# Patient Record
Sex: Female | Born: 1955 | Race: White | Hispanic: No | Marital: Married | Smoking: Never smoker
Health system: Southern US, Community
[De-identification: ages and names within clinical notes are randomized; demographics above are authoritative.]

## PROBLEM LIST (undated history)

## (undated) DIAGNOSIS — R112 Nausea with vomiting, unspecified: Secondary | ICD-10-CM

## (undated) DIAGNOSIS — Z9889 Other specified postprocedural states: Secondary | ICD-10-CM

## (undated) DIAGNOSIS — E785 Hyperlipidemia, unspecified: Secondary | ICD-10-CM

## (undated) DIAGNOSIS — I341 Nonrheumatic mitral (valve) prolapse: Secondary | ICD-10-CM

## (undated) DIAGNOSIS — K219 Gastro-esophageal reflux disease without esophagitis: Secondary | ICD-10-CM

## (undated) HISTORY — PX: FOOT NEUROMA SURGERY: SHX646

## (undated) HISTORY — PX: ABDOMINAL HYSTERECTOMY: SHX81

## (undated) HISTORY — PX: CHOLECYSTECTOMY: SHX55

## (undated) HISTORY — PX: OTHER SURGICAL HISTORY: SHX169

---

## 2015-04-22 ENCOUNTER — Encounter (HOSPITAL_BASED_OUTPATIENT_CLINIC_OR_DEPARTMENT_OTHER): Payer: Self-pay | Admitting: *Deleted

## 2015-04-22 ENCOUNTER — Other Ambulatory Visit: Payer: Self-pay | Admitting: Orthopedic Surgery

## 2015-04-23 ENCOUNTER — Other Ambulatory Visit: Payer: Self-pay | Admitting: Orthopedic Surgery

## 2015-04-26 NOTE — H&P (Signed)
Erica Fisher is an 59 y.o. female.   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  She has thus far been treated nonoperatively with a sling and medications, and has undergone a CT scan in addition to initial x-ray evaluation.  Her pain has diminished some, and she has developed extensive bruising down the posterior/lateral aspect of the arm.  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  In addition, she had a previous left humerus fracture that I believe was treated nonoperatively with good result.  Past Medical History  Diagnosis Date  . PONV (postoperative nausea and vomiting)   . Hyperlipemia   . GERD (gastroesophageal reflux disease)   . MVP (mitral valve prolapse)     Past Surgical History  Procedure Laterality Date  . Cholecystectomy    . Abdominal hysterectomy    . Hand surg Left     several reconstructive surgeries following accident  . Foot neuroma surgery      History reviewed. No pertinent family history. Social History:  reports that she has never smoked. She has never used smokeless tobacco. She reports that she does not drink alcohol or use illicit drugs.  Allergies:  Allergies  Allergen Reactions  . Penicillins Rash  . Sulfa Antibiotics Rash    No prescriptions prior to admission    No results found for this or any previous visit (from the past 48 hour(s)). No results found.  Review of Systems  All other systems reviewed and are negative.   Height 5\' 5"  (1.651 m), weight 80.287 kg (177 lb). Physical Exam  Constitutional:  WD, WN, NAD HEENT:  NCAT, EOMI Neuro/Psych:  Alert & oriented to person, place, and time; appropriate mood & affect Lymphatic: No generalized UE  edema or lymphadenopathy Extremities / MSK:  Both UE are normal with respect to appearance, ranges of motion, joint stability, muscle strength/tone, sensation, & perfusion except as otherwise noted:  The right shoulder is swollen, with some visible asymmetry versus the left side and obviously bruised.  Intact light touch sensibility in the axillary distribution as well as the radial, median, and ulnar nerve distributions with intact motor to the same.  Great motion from the elbow down.  Labs / X-rays:  3 views of the right shoulder ordered and obtained today reveals a comminuted four-part proximal humerus fracture with marked displacement of the head had pieces from the shaft, and some articular impaction.  The glenohumeral joint is reduced.  CT scan is also available for review  Assessment: Comminuted displaced closed right four-part humerus fracture  Plan:  I discussed these findings with her and her family, including her son and husband.  We discussed the competing options of continued closed management versus open treatment of the fracture with ORIF versus prosthetic replacement.  After careful deliberation of the pros and cons of the competing options, we elected to proceed with ORIF of this fracture.  We discussed details of the problem, reviewed radiographs and used a plastic model in the process.  I reviewed with her the surgical plan, including incisions and exposure and specific risks such as bleeding, infection, damage to nerves (especially actively nerve), and avascular necrosis.  We will likely proceed next week,  possibly on Wednesday, as I will need to rearrange biweekly schedule in order to best accommodate providing this care.  She was provided a prescription for Percocet in case she would run out between now and then may continue to use her sling, sleeping more upright in a recliner if necessary.  We also discussed some of the postoperative expectations including therapy, et Karie Soda.    Ericberto Padget A. 04/26/2015, 2:53 PM

## 2015-04-28 ENCOUNTER — Ambulatory Visit (HOSPITAL_BASED_OUTPATIENT_CLINIC_OR_DEPARTMENT_OTHER)
Admission: RE | Admit: 2015-04-28 | Discharge: 2015-04-28 | Disposition: A | Discharge status: Home / Self Care | Payer: Worker's Compensation | Source: Ambulatory Visit | Attending: Orthopedic Surgery | Admitting: Orthopedic Surgery

## 2015-04-28 ENCOUNTER — Encounter (HOSPITAL_BASED_OUTPATIENT_CLINIC_OR_DEPARTMENT_OTHER)
Admission: RE | Disposition: A | Discharge status: Home / Self Care | Payer: Self-pay | Source: Ambulatory Visit | Attending: Orthopedic Surgery

## 2015-04-28 ENCOUNTER — Ambulatory Visit (HOSPITAL_COMMUNITY): Payer: Worker's Compensation

## 2015-04-28 ENCOUNTER — Ambulatory Visit (HOSPITAL_BASED_OUTPATIENT_CLINIC_OR_DEPARTMENT_OTHER): Payer: Worker's Compensation | Admitting: Anesthesiology

## 2015-04-28 ENCOUNTER — Encounter (HOSPITAL_BASED_OUTPATIENT_CLINIC_OR_DEPARTMENT_OTHER): Payer: Self-pay | Admitting: Anesthesiology

## 2015-04-28 DIAGNOSIS — Z79899 Other long term (current) drug therapy: Secondary | ICD-10-CM | POA: Diagnosis not present

## 2015-04-28 DIAGNOSIS — E785 Hyperlipidemia, unspecified: Secondary | ICD-10-CM | POA: Insufficient documentation

## 2015-04-28 DIAGNOSIS — S42201A Unspecified fracture of upper end of right humerus, initial encounter for closed fracture: Secondary | ICD-10-CM | POA: Diagnosis not present

## 2015-04-28 DIAGNOSIS — Z419 Encounter for procedure for purposes other than remedying health state, unspecified: Secondary | ICD-10-CM

## 2015-04-28 DIAGNOSIS — Z9049 Acquired absence of other specified parts of digestive tract: Secondary | ICD-10-CM | POA: Insufficient documentation

## 2015-04-28 DIAGNOSIS — W109XXA Fall (on) (from) unspecified stairs and steps, initial encounter: Secondary | ICD-10-CM | POA: Diagnosis not present

## 2015-04-28 DIAGNOSIS — I341 Nonrheumatic mitral (valve) prolapse: Secondary | ICD-10-CM | POA: Diagnosis not present

## 2015-04-28 DIAGNOSIS — K219 Gastro-esophageal reflux disease without esophagitis: Secondary | ICD-10-CM | POA: Insufficient documentation

## 2015-04-28 HISTORY — DX: Hyperlipidemia, unspecified: E78.5

## 2015-04-28 HISTORY — PX: ORIF HUMERUS FRACTURE: SHX2126

## 2015-04-28 HISTORY — DX: Other specified postprocedural states: Z98.890

## 2015-04-28 HISTORY — DX: Gastro-esophageal reflux disease without esophagitis: K21.9

## 2015-04-28 HISTORY — DX: Other specified postprocedural states: R11.2

## 2015-04-28 HISTORY — DX: Nonrheumatic mitral (valve) prolapse: I34.1

## 2015-04-28 SURGERY — OPEN REDUCTION INTERNAL FIXATION (ORIF) PROXIMAL HUMERUS FRACTURE
Anesthesia: General | Site: Shoulder | Laterality: Right

## 2015-04-28 MED ORDER — SCOPOLAMINE 1 MG/3DAYS TD PT72
MEDICATED_PATCH | TRANSDERMAL | Status: AC
  Filled 2015-04-28: qty 1

## 2015-04-28 MED ORDER — ONDANSETRON HCL 4 MG/2ML IJ SOLN
INTRAMUSCULAR | Status: AC
Start: 2015-04-28 — End: 2015-04-28
  Filled 2015-04-28: qty 2

## 2015-04-28 MED ORDER — DEXAMETHASONE SODIUM PHOSPHATE 4 MG/ML IJ SOLN
INTRAMUSCULAR | Status: DC | PRN
  Administered 2015-04-28: 10 mg via INTRAVENOUS

## 2015-04-28 MED ORDER — SUCCINYLCHOLINE CHLORIDE 20 MG/ML IJ SOLN
INTRAMUSCULAR | Status: AC
  Filled 2015-04-28: qty 1

## 2015-04-28 MED ORDER — MIDAZOLAM HCL 2 MG/2ML IJ SOLN
INTRAMUSCULAR | Status: AC
  Filled 2015-04-28: qty 2

## 2015-04-28 MED ORDER — 0.9 % SODIUM CHLORIDE (POUR BTL) OPTIME
TOPICAL | Status: DC | PRN
  Administered 2015-04-28: 200 mL

## 2015-04-28 MED ORDER — SUCCINYLCHOLINE CHLORIDE 20 MG/ML IJ SOLN
INTRAMUSCULAR | Status: DC | PRN
  Administered 2015-04-28: 100 mg via INTRAVENOUS

## 2015-04-28 MED ORDER — MEPERIDINE HCL 25 MG/ML IJ SOLN: 6.2500 mg | INTRAMUSCULAR | Status: DC | PRN

## 2015-04-28 MED ORDER — ONDANSETRON HCL 4 MG/2ML IJ SOLN
INTRAMUSCULAR | Status: DC | PRN
  Administered 2015-04-28: 4 mg via INTRAVENOUS

## 2015-04-28 MED ORDER — FENTANYL CITRATE (PF) 100 MCG/2ML IJ SOLN
INTRAMUSCULAR | Status: AC
  Filled 2015-04-28: qty 2

## 2015-04-28 MED ORDER — FENTANYL CITRATE (PF) 100 MCG/2ML IJ SOLN
50.0000 ug | INTRAMUSCULAR | Status: DC | PRN
  Administered 2015-04-28: 100 ug via INTRAVENOUS

## 2015-04-28 MED ORDER — GLYCOPYRROLATE 0.2 MG/ML IJ SOLN: 0.2000 mg | Freq: Once | INTRAMUSCULAR | Status: DC | PRN

## 2015-04-28 MED ORDER — CEFAZOLIN SODIUM-DEXTROSE 2-3 GM-% IV SOLR
2.0000 g | INTRAVENOUS | Status: AC
  Administered 2015-04-28: 2 g via INTRAVENOUS

## 2015-04-28 MED ORDER — LACTATED RINGERS IV SOLN: INTRAVENOUS | Status: DC

## 2015-04-28 MED ORDER — HYDROMORPHONE HCL 1 MG/ML IJ SOLN
INTRAMUSCULAR | Status: AC
  Filled 2015-04-28: qty 1

## 2015-04-28 MED ORDER — DEXAMETHASONE SODIUM PHOSPHATE 10 MG/ML IJ SOLN
INTRAMUSCULAR | Status: AC
  Filled 2015-04-28: qty 1

## 2015-04-28 MED ORDER — PHENYLEPHRINE HCL 10 MG/ML IJ SOLN
10.0000 mg | INTRAVENOUS | Status: DC | PRN
  Administered 2015-04-28: 25 ug via INTRAVENOUS

## 2015-04-28 MED ORDER — BUPIVACAINE-EPINEPHRINE (PF) 0.5% -1:200000 IJ SOLN
INTRAMUSCULAR | Status: DC | PRN
  Administered 2015-04-28: 25 mL via PERINEURAL

## 2015-04-28 MED ORDER — SCOPOLAMINE 1 MG/3DAYS TD PT72
1.0000 | MEDICATED_PATCH | Freq: Once | TRANSDERMAL | Status: DC | PRN
  Administered 2015-04-28: 1.5 mg via TRANSDERMAL

## 2015-04-28 MED ORDER — MIDAZOLAM HCL 2 MG/2ML IJ SOLN
1.0000 mg | INTRAMUSCULAR | Status: DC | PRN
  Administered 2015-04-28 (×2): 1 mg via INTRAVENOUS
  Administered 2015-04-28: 2 mg via INTRAVENOUS

## 2015-04-28 MED ORDER — OXYCODONE HCL 5 MG PO TABS: 5.0000 mg | ORAL_TABLET | Freq: Once | ORAL | Status: DC | PRN

## 2015-04-28 MED ORDER — LIDOCAINE HCL (CARDIAC) 20 MG/ML IV SOLN
INTRAVENOUS | Status: DC | PRN
  Administered 2015-04-28: 50 mg via INTRAVENOUS

## 2015-04-28 MED ORDER — OXYCODONE HCL 5 MG/5ML PO SOLN
5.0000 mg | Freq: Once | ORAL | Status: DC | PRN
Start: 2015-04-28 — End: 2015-04-28

## 2015-04-28 MED ORDER — CEFAZOLIN SODIUM-DEXTROSE 2-3 GM-% IV SOLR
INTRAVENOUS | Status: AC
  Filled 2015-04-28: qty 50

## 2015-04-28 MED ORDER — LACTATED RINGERS IV SOLN
INTRAVENOUS | Status: DC
  Administered 2015-04-28: 10 mL/h via INTRAVENOUS
  Administered 2015-04-28: 13:00:00 via INTRAVENOUS

## 2015-04-28 MED ORDER — HYDROMORPHONE HCL 1 MG/ML IJ SOLN
0.2500 mg | INTRAMUSCULAR | Status: DC | PRN
  Administered 2015-04-28: 0.5 mg via INTRAVENOUS

## 2015-04-28 MED ORDER — LIDOCAINE HCL (CARDIAC) 20 MG/ML IV SOLN
INTRAVENOUS | Status: AC
  Filled 2015-04-28: qty 5

## 2015-04-28 MED ORDER — PHENYLEPHRINE HCL 10 MG/ML IJ SOLN
INTRAMUSCULAR | Status: DC | PRN
  Administered 2015-04-28: 80 ug via INTRAVENOUS
  Administered 2015-04-28: 40 ug via INTRAVENOUS
  Administered 2015-04-28: 80 ug via INTRAVENOUS
  Administered 2015-04-28: 40 ug via INTRAVENOUS
  Administered 2015-04-28: 80 ug via INTRAVENOUS

## 2015-04-28 MED ORDER — OXYCODONE-ACETAMINOPHEN 5-325 MG PO TABS: 1.0000 | ORAL_TABLET | Freq: Four times a day (QID) | ORAL | Status: DC | PRN

## 2015-04-28 MED ORDER — PROPOFOL 10 MG/ML IV BOLUS
INTRAVENOUS | Status: DC | PRN
  Administered 2015-04-28: 200 mg via INTRAVENOUS

## 2015-04-28 SURGICAL SUPPLY — 66 items
#2 MAXBRAID PE SUTURE ×8 IMPLANT
BIT DRILL 3.2 (BIT) ×1
BIT DRILL 3.2XCALB NS DISP (BIT) ×1 IMPLANT
BIT DRILL CALIBRATED 2.7 (BIT) ×2 IMPLANT
BIT DRL 3.2XCALB NS DISP (BIT) ×1
BLADE SURG 10 STRL SS (BLADE) ×2 IMPLANT
BLADE SURG 15 STRL LF DISP TIS (BLADE) ×1 IMPLANT
BLADE SURG 15 STRL SS (BLADE) ×1
CHLORAPREP W/TINT 26ML (MISCELLANEOUS) ×2 IMPLANT
CLEANER CAUTERY TIP 5X5 PAD (MISCELLANEOUS) ×1 IMPLANT
DRAPE C-ARM 42X72 X-RAY (DRAPES) ×2 IMPLANT
DRAPE SURG 17X23 STRL (DRAPES) ×2 IMPLANT
DRAPE U 20/CS (DRAPES) IMPLANT
DRAPE U-SHAPE 47X51 STRL (DRAPES) ×2 IMPLANT
DRAPE U-SHAPE 76X120 STRL (DRAPES) ×4 IMPLANT
DRSG ADAPTIC 3X8 NADH LF (GAUZE/BANDAGES/DRESSINGS) ×2 IMPLANT
DRSG PAD ABDOMINAL 8X10 ST (GAUZE/BANDAGES/DRESSINGS) ×2 IMPLANT
DRSG TEGADERM 4X4.75 (GAUZE/BANDAGES/DRESSINGS) IMPLANT
ELECT REM PT RETURN 9FT ADLT (ELECTROSURGICAL) ×2
ELECTRODE REM PT RTRN 9FT ADLT (ELECTROSURGICAL) ×1 IMPLANT
GAUZE SPONGE 4X4 12PLY STRL (GAUZE/BANDAGES/DRESSINGS) ×2 IMPLANT
GLOVE BIO SURGEON STRL SZ7.5 (GLOVE) ×2 IMPLANT
GLOVE BIOGEL PI IND STRL 7.0 (GLOVE) ×3 IMPLANT
GLOVE BIOGEL PI IND STRL 8 (GLOVE) ×1 IMPLANT
GLOVE BIOGEL PI INDICATOR 7.0 (GLOVE) ×3
GLOVE BIOGEL PI INDICATOR 8 (GLOVE) ×1
GLOVE ECLIPSE 6.5 STRL STRAW (GLOVE) ×4 IMPLANT
GOWN STRL REUS W/ TWL LRG LVL3 (GOWN DISPOSABLE) ×2 IMPLANT
GOWN STRL REUS W/TWL LRG LVL3 (GOWN DISPOSABLE) ×2
GOWN STRL REUS W/TWL XL LVL3 (GOWN DISPOSABLE) ×2 IMPLANT
K-WIRE 2X5 SS THRDED S3 (WIRE) ×8
KWIRE 2X5 SS THRDED S3 (WIRE) ×4 IMPLANT
NDL SUT 6 .5 CRC .975X.05 MAYO (NEEDLE) ×1 IMPLANT
NEEDLE MAYO TAPER (NEEDLE) ×1
NS IRRIG 1000ML POUR BTL (IV SOLUTION) ×2 IMPLANT
PACK ARTHROSCOPY DSU (CUSTOM PROCEDURE TRAY) ×2 IMPLANT
PACK BASIN DAY SURGERY FS (CUSTOM PROCEDURE TRAY) ×2 IMPLANT
PAD CLEANER CAUTERY TIP 5X5 (MISCELLANEOUS) ×1
PEG LOCKING 3.2MMX44 (Peg) ×2 IMPLANT
PEG LOCKING 3.2X34 (Screw) ×6 IMPLANT
PEG LOCKING 3.2X36 (Screw) ×2 IMPLANT
PEG LOCKING 3.2X38 (Screw) ×2 IMPLANT
PEG LOCKING 3.2X40 (Peg) ×2 IMPLANT
PENCIL BUTTON HOLSTER BLD 10FT (ELECTRODE) ×2 IMPLANT
PLATE PROX HUM HI R 3H 80 (Plate) ×2 IMPLANT
SCREW LOCK CORT STAR 3.5X26 (Screw) ×2 IMPLANT
SCREW LOCK CORT STAR 3.5X28 (Screw) ×2 IMPLANT
SCREW LOCK CORT STAR 3.5X40 (Screw) ×2 IMPLANT
SCREW LP NL T15 3.5X26 (Screw) ×2 IMPLANT
SCREW PEG LOCK 3.2X30MM (Screw) ×2 IMPLANT
SLEEVE MEASURING 3.2 (BIT) ×2 IMPLANT
SLEEVE SCD COMPRESS KNEE MED (MISCELLANEOUS) ×2 IMPLANT
SLING ARM FOAM STRAP LRG (SOFTGOODS) ×2 IMPLANT
SPONGE LAP 18X18 X RAY DECT (DISPOSABLE) ×2 IMPLANT
STAPLER VISISTAT 35W (STAPLE) ×2 IMPLANT
STOCKINETTE 6  STRL (DRAPES) ×1
STOCKINETTE 6 STRL (DRAPES) ×1 IMPLANT
SUCTION FRAZIER TIP 10 FR DISP (SUCTIONS) ×2 IMPLANT
SUPPORT WRAP ARM LG (MISCELLANEOUS) ×2 IMPLANT
SUT VIC AB 2-0 CT1 27 (SUTURE) ×1
SUT VIC AB 2-0 CT1 TAPERPNT 27 (SUTURE) ×1 IMPLANT
SUT VICRYL RAPIDE 4-0 (SUTURE) ×2 IMPLANT
SYR BULB 3OZ (MISCELLANEOUS) ×2 IMPLANT
TOWEL OR 17X24 6PK STRL BLUE (TOWEL DISPOSABLE) ×2 IMPLANT
TOWEL OR NON WOVEN STRL DISP B (DISPOSABLE) ×2 IMPLANT
YANKAUER SUCT BULB TIP NO VENT (SUCTIONS) ×2 IMPLANT

## 2015-04-28 NOTE — Interval H&P Note (Signed)
History and Physical Interval Note:  04/28/2015 12:16 PM  Erica Fisher  has presented today for surgery, with the diagnosis of RIGHT PROXIMAL HUMERUS FRACTURE  The various methods of treatment have been discussed with the patient and family. After consideration of risks, benefits and other options for treatment, the patient has consented to  Procedure(s): OPEN TREATMENT OF RIGHT PROXIMAL HUMERUS FRACTURE (Right) as a surgical intervention .  The patient's history has been reviewed, patient examined, no change in status, stable for surgery.  I have reviewed the patient's chart and labs.  Questions were answered to the patient's satisfaction.     Ronelle Smallman A.

## 2015-04-28 NOTE — Op Note (Signed)
04/28/2015  12:16 PM  PATIENT:  Erica KernsElisabeth Fisher  59 y.o. female  PRE-OPERATIVE DIAGNOSIS:  Displaced right proximal humerus fracture  POST-OPERATIVE DIAGNOSIS:  Same  PROCEDURE:  ORIF right proximal humerus fracture with open biceps tenodesis  SURGEON: Cliffton Astersavid A. Janee Mornhompson, MD  PHYSICIAN ASSISTANT: Danielle RankinKirsten Schrader, OPA-C  ANESTHESIA:  regional and general  SPECIMENS:  None  DRAINS:   None  EBL:  200 mL  PREOPERATIVE INDICATIONS:  Erica Fisher is a  59 y.o. female with a displaced right proximal humerus fracture having fallen down stairs.    The risks benefits and alternatives were discussed with the patient preoperatively including but not limited to the risks of infection, bleeding, nerve injury, cardiopulmonary complications, the need for revision surgery, among others, and the patient verbalized understanding and consented to proceed.  OPERATIVE IMPLANTS: [employer] ALPS proximal humerus plate / screws/pegs  OPERATIVE PROCEDURE:  After receiving prophylactic antibiotics and a regional block, the patient was escorted to the operative theatre and placed in a supine position.  General anesthesia was administered. A surgical "time-out" was performed during which the planned procedure, proposed operative site, and the correct patient identity were compared to the operative consent and agreement confirmed by the circulating nurse according to current facility policy.  The right upper extremity was prepped with DuraPrep and draped in usual sterile fashion. Prophylactic antibiotics were administered. The arm was positioned in an automated positioner.  A deltopectoral approach was made sharply with a scalpel, with blunt dissection once the deltopectoral interval was identified. The deltoid was retracted. The fracture was identified. #2 max braid suture was placed into the cuff posteriorly, superiorly, and also through cuff on the lesser tuberosity fragment. Clot and debris were removed.  The fracture was ultimately better reduced through manipulation digitally and with clamps and positioning. Ultimately, with the greater and lesser tuberosity fragments better reduced having elevated the articular surface and the superior fragment, they were tied to one another. The plate was then laid in its normal position lateral to the bicipital groove it was provisionally secured with a K wire to the shaft. Then, 1 was placed into position for the central guidepin. Images were obtained, but planes for imaging was limited somewhat by the beachchair positioner. Ultimately, it appeared as if the best position improving the varus alignment of the fracture was with the shoulder held abducted, and in this position a couple K wires were placed through the plate into the head to provide a provisional reduction. This was then viewed fluoroscopically and other views and thought to be acceptable at this point. All of the proximal holes were drilled, measured, and filled with smooth pegs. Those down the shaft were drilled and filled with cortical screws, one nonlocking and the others locking. #2 max braid was placed again into the posterior and anterior portions of the cuff near the bone tendon junction and brought through the plate and tied laterally. The same was done with the superior portion of the cuff. The fracture all moved as a unit fluoroscopically and seemed fairly stable. Forward elevation of at least 160 could be obtained passively, and at 90 of abduction, internal rotation measured 60, external rotation close to 90. As I move the shoulder through its range of motion, there is no catching or clicking as might be associated with extra-articular hardware. Rotator interval was split so that the intra-articular portion of the biceps tendon could be excised at the biceps tendon was quite ratty at the fracture site. Biceps tenodesis was performed  to adjacent soft tissues at the extra-articular portion of the tendon  before the tendon was split in the intra-articular portions excised. Rotator interval was then closed with #2 max braid suture. The wound is copiously irrigated and final images were obtained. The deltopectoral interval was reapproximated with 2-0 Vicryl running suture followed by closure of the subcutaneous tissues and skin with 2-0 Vicryl suture, the last layer which was buried subcuticular. The skin was reapproximated with staples. A dressing was applied as was a sling, and she was awakened and taken to the recovery room in stable condition, breathing spontaneously    DISPOSITION: She'll be discharged home with typical instructions returning in approximately 2 weeks with new x-rays of the right shoulder and plans to initiate therapy, which she prefers a be closer to her home.

## 2015-04-28 NOTE — Transfer of Care (Signed)
Immediate Anesthesia Transfer of Care Note  Patient: Erica Fisher  Procedure(s) Performed: Procedure(s): OPEN TREATMENT OF RIGHT PROXIMAL HUMERUS FRACTURE (Right)  Patient Location: PACU  Anesthesia Type:General and GA combined with regional for post-op pain  Level of Consciousness: awake  Airway & Oxygen Therapy: Patient Spontanous Breathing  Post-op Assessment: Report given to RN and Post -op Vital signs reviewed and stable  Post vital signs: Reviewed and stable  Last Vitals:  Filed Vitals:   04/28/15 1055 04/28/15 1100  BP:  146/84  Pulse: 77 84  Temp:    Resp: 15 11    Complications: No apparent anesthesia complications

## 2015-04-28 NOTE — Anesthesia Preprocedure Evaluation (Signed)
Anesthesia Evaluation  Patient identified by MRN, date of birth, ID band Patient awake    Reviewed: Allergy & Precautions, NPO status , Patient's Chart, lab work & pertinent test results  History of Anesthesia Complications (+) PONV  Airway Mallampati: I  TM Distance: >3 FB Neck ROM: Full    Dental  (+) Teeth Intact, Dental Advisory Given   Pulmonary    breath sounds clear to auscultation       Cardiovascular  Rhythm:Regular Rate:Normal     Neuro/Psych    GI/Hepatic GERD  Medicated and Controlled,  Endo/Other    Renal/GU      Musculoskeletal   Abdominal   Peds  Hematology   Anesthesia Other Findings   Reproductive/Obstetrics                             Anesthesia Physical Anesthesia Plan  ASA: II  Anesthesia Plan: General   Post-op Pain Management: MAC Combined w/ Regional for Post-op pain   Induction: Intravenous  Airway Management Planned: Oral ETT  Additional Equipment:   Intra-op Plan:   Post-operative Plan: Extubation in OR  Informed Consent: I have reviewed the patients History and Physical, chart, labs and discussed the procedure including the risks, benefits and alternatives for the proposed anesthesia with the patient or authorized representative who has indicated his/her understanding and acceptance.   Dental advisory given  Plan Discussed with: CRNA, Anesthesiologist and Surgeon  Anesthesia Plan Comments:         Anesthesia Quick Evaluation

## 2015-04-28 NOTE — Discharge Instructions (Addendum)
Discharge Instructions   You have a light dressing on your shoulder and a sling. You may begin gentle motion of your fingers and hand immediately, but you should not do any heavy lifting or gripping.  Elevate your hand to reduce pain & swelling of the digits.  Ice over the operative site may be helpful to reduce pain & swelling.  DO NOT USE HEAT. Pain medicine has been prescribed for you.  Use your medicine as needed over the first 48 hours, and then you can begin to taper your use. You may use Tylenol in place of your prescribed pain medication, but not IN ADDITION to it. Leave the dressing in place until the fifth day after your surgery and then remove it, leaving it open to air.  After the bandage has been removed you may shower, regularly washing the incision and letting the water run over it, but not submerging it (no swimming, soaking it in dishwater, etc.) You may drive a car when you are off of prescription pain medications and can safely control your vehicle with both hands. We will address whether therapy will be required or not when you return to the office. You may have already made your follow-up appointment when we completed your preop visit.  If not, please call our office today or the next business day to make your return appointment for 10-15 days after surgery.   Please call [phone] during normal business hours or [phone] after hours for any problems. Including the following:  - excessive redness of the incisions - drainage for more than 4 days - fever of more than 101.5 F  *Please note that pain medications will not be refilled after hours or on weekends.  Regional Anesthesia Blocks  1. Numbness or the inability to move the "blocked" extremity may last from 3-48 hours after placement. The length of time depends on the medication injected and your individual response to the medication. If the numbness is not going away after 48 hours, call your surgeon.  2. The  extremity that is blocked will need to be protected until the numbness is gone and the  Strength has returned. Because you cannot feel it, you will need to take extra care to avoid injury. Because it may be weak, you may have difficulty moving it or using it. You may not know what position it is in without looking at it while the block is in effect.  3. For blocks in the legs and feet, returning to weight bearing and walking needs to be done carefully. You will need to wait until the numbness is entirely gone and the strength has returned. You should be able to move your leg and foot normally before you try and bear weight or walk. You will need someone to be with you when you first try to ensure you do not fall and possibly risk injury.  4. Bruising and tenderness at the needle site are common side effects and will resolve in a few days.  5. Persistent numbness or new problems with movement should be communicated to the surgeon or the [facility]  [phone])/ [facility] [phone]).  Post Anesthesia Home Care Instructions  Activity: Get plenty of rest for the remainder of the day. A responsible adult should stay with you for 24 hours following the procedure.  For the next 24 hours, DO NOT: -Drive a car -Advertising copywriterperate machinery -Drink alcoholic beverages -Take any medication unless instructed by your physician -Make any legal decisions  or sign important papers.  Meals: Start with liquid foods such as gelatin or soup. Progress to regular foods as tolerated. Avoid greasy, spicy, heavy foods. If nausea and/or vomiting occur, drink only clear liquids until the nausea and/or vomiting subsides. Call your physician if vomiting continues.  Special Instructions/Symptoms: Your throat may feel dry or sore from the anesthesia or the breathing tube placed in your throat during surgery. If this causes discomfort, gargle with warm salt water. The discomfort should disappear within  24 hours.  If you had a scopolamine patch placed behind your ear for the management of post- operative nausea and/or vomiting:  1. The medication in the patch is effective for 72 hours, after which it should be removed.  Wrap patch in a tissue and discard in the trash. Wash hands thoroughly with soap and water. 2. You may remove the patch earlier than 72 hours if you experience unpleasant side effects which may include dry mouth, dizziness or visual disturbances. 3. Avoid touching the patch. Wash your hands with soap and water after contact with the patch.

## 2015-04-28 NOTE — Progress Notes (Signed)
Assisted Dr. Crews with right, ultrasound guided, interscalene  block. Side rails up, monitors on throughout procedure. See vital signs in flow sheet. Tolerated Procedure well. 

## 2015-04-28 NOTE — Anesthesia Postprocedure Evaluation (Signed)
Anesthesia Post Note  Patient: Erica Fisher  Procedure(s) Performed: Procedure(s) (LRB): OPEN TREATMENT OF RIGHT PROXIMAL HUMERUS FRACTURE (Right)  Patient location during evaluation: PACU Anesthesia Type: General Level of consciousness: awake and alert Pain management: pain level controlled Vital Signs Assessment: post-procedure vital signs reviewed and stable Respiratory status: spontaneous breathing, nonlabored ventilation and respiratory function stable Cardiovascular status: blood pressure returned to baseline and stable Postop Assessment: no signs of nausea or vomiting Anesthetic complications: no    Last Vitals:  Filed Vitals:   04/28/15 1545 04/28/15 1600  BP: 131/78 142/67  Pulse: 89 90  Temp:    Resp: 15 12    Last Pain:  Filed Vitals:   04/28/15 1606  PainSc: 4                  [name] A

## 2015-04-28 NOTE — Anesthesia Procedure Notes (Addendum)
Anesthesia Regional Block:  Interscalene brachial plexus block  Pre-Anesthetic Checklist: ,, timeout performed, Correct Patient, Correct Site, Correct Laterality, Correct Procedure, Correct Position, site marked, Risks and benefits discussed,  Surgical consent,  Pre-op evaluation,  At surgeon's request and post-op pain management  Laterality: Right and Upper  Prep: chloraprep       Needles:  Injection technique: Single-shot  Needle Type: Echogenic Needle     Needle Length: 5cm 5 cm Needle Gauge: 21 and 21 G    Additional Needles:  Procedures: ultrasound guided (picture in chart) Interscalene brachial plexus block Narrative:  Start time: 04/28/2015 11:00 AM End time: 04/28/2015 11:05 AM Injection made incrementally with aspirations every 5 mL.  Performed by: Personally  Anesthesiologist: CREWS, DAVID   Procedure Name: Intubation Date/Time: 04/28/2015 12:31 PM Performed by: Caren MacadamARTER, Ad Guttman W Pre-anesthesia Checklist: Patient identified, Emergency Drugs available, Suction available and Patient being monitored Patient Re-evaluated:Patient Re-evaluated prior to inductionOxygen Delivery Method: Circle System Utilized Preoxygenation: Pre-oxygenation with 100% oxygen Intubation Type: IV induction Ventilation: Mask ventilation without difficulty Laryngoscope Size: Miller and 2 Grade View: Grade I Tube type: Oral Tube size: 7.0 mm Number of attempts: 1 Airway Equipment and Method: Stylet and Oral airway Placement Confirmation: ETT inserted through vocal cords under direct vision,  positive ETCO2 and breath sounds checked- equal and bilateral Secured at: 22 cm Tube secured with: Tape Dental Injury: Teeth and Oropharynx as per pre-operative assessment

## 2015-04-29 ENCOUNTER — Encounter (HOSPITAL_BASED_OUTPATIENT_CLINIC_OR_DEPARTMENT_OTHER): Payer: Self-pay | Admitting: Orthopedic Surgery

## 2018-07-22 ENCOUNTER — Other Ambulatory Visit: Payer: Self-pay | Admitting: Orthopedic Surgery

## 2018-07-22 NOTE — Patient Instructions (Signed)
Erica Fisher  07/22/2018   Your procedure is scheduled on: Thursday 07/25/2018  Report to [facility] Main  Entrance              Report to Short Stay at  0530  AM    Call this number if you have problems the morning of surgery [phone]    Remember: Do not eat food or drink liquids :After Midnight.               BRUSH YOUR TEETH MORNING OF SURGERY AND RINSE YOUR MOUTH OUT, NO CHEWING GUM CANDY OR MINTS.     Take these medicines the morning of surgery with A SIP OF WATER: none                                You may not have any metal on your body including hair pins and              piercings  Do not wear jewelry, make-up, lotions, powders or perfumes, deodorant             Do not wear nail polish.  Do not shave  48 hours prior to surgery.             Do not bring valuables to the hospital. [facility] IS NOT             RESPONSIBLE   FOR VALUABLES.  Contacts, dentures or bridgework may not be worn into surgery.  Leave suitcase in the car. After surgery it may be brought to your room.                   Please read over the following fact sheets you were given: _____________________________________________________________________             [facility] - Preparing for Surgery Before surgery, you can play an important role.  Because skin is not sterile, your skin needs to be as free of germs as possible.  You can reduce the number of germs on your skin by washing with CHG (chlorahexidine gluconate) soap before surgery.  CHG is an antiseptic cleaner which kills germs and bonds with the skin to continue killing germs even after washing. Please DO NOT use if you have an allergy to CHG or antibacterial soaps.  If your skin becomes reddened/irritated stop using the CHG and inform your nurse when you arrive at Short Stay. Do not shave (including legs and underarms) for at least 48 hours prior to the first CHG shower.  You may shave your  face/neck. Please follow these instructions carefully:  1.  Shower with CHG Soap the night before surgery and the  morning of Surgery.  2.  If you choose to wash your hair, wash your hair first as usual with your  normal  shampoo.  3.  After you shampoo, rinse your hair and body thoroughly to remove the  shampoo.                           4.  Use CHG as you would any other liquid soap.  You can apply chg directly  to the skin and wash  Gently with a scrungie or clean washcloth.  5.  Apply the CHG Soap to your body ONLY FROM THE NECK DOWN.   Do not use on face/ open                           Wound or open sores. Avoid contact with eyes, ears mouth and genitals (private parts).                       Wash face,  Genitals (private parts) with your normal soap.             6.  Wash thoroughly, paying special attention to the area where your surgery  will be performed.  7.  Thoroughly rinse your body with warm water from the neck down.  8.  DO NOT shower/wash with your normal soap after using and rinsing off  the CHG Soap.                9.  Pat yourself dry with a clean towel.            10.  Wear clean pajamas.            11.  Place clean sheets on your bed the night of your first shower and do not  sleep with pets. Day of Surgery : Do not apply any lotions/deodorants the morning of surgery.  Please wear clean clothes to the hospital/surgery center.  FAILURE TO FOLLOW THESE INSTRUCTIONS MAY RESULT IN THE CANCELLATION OF YOUR SURGERY PATIENT SIGNATURE_________________________________  NURSE SIGNATURE__________________________________  ________________________________________________________________________   Adam Phenix  An incentive spirometer is a tool that can help keep your lungs clear and active. This tool measures how well you are filling your lungs with each breath. Taking long deep breaths may help reverse or decrease the chance of developing breathing  (pulmonary) problems (especially infection) following:  A long period of time when you are unable to move or be active. BEFORE THE PROCEDURE   If the spirometer includes an indicator to show your best effort, your nurse or respiratory therapist will set it to a desired goal.  If possible, sit up straight or lean slightly forward. Try not to slouch.  Hold the incentive spirometer in an upright position. INSTRUCTIONS FOR USE  1. Sit on the edge of your bed if possible, or sit up as far as you can in bed or on a chair. 2. Hold the incentive spirometer in an upright position. 3. Breathe out normally. 4. Place the mouthpiece in your mouth and seal your lips tightly around it. 5. Breathe in slowly and as deeply as possible, raising the piston or the ball toward the top of the column. 6. Hold your breath for 3-5 seconds or for as long as possible. Allow the piston or ball to fall to the bottom of the column. 7. Remove the mouthpiece from your mouth and breathe out normally. 8. Rest for a few seconds and repeat Steps 1 through 7 at least 10 times every 1-2 hours when you are awake. Take your time and take a few normal breaths between deep breaths. 9. The spirometer may include an indicator to show your best effort. Use the indicator as a goal to work toward during each repetition. 10. After each set of 10 deep breaths, practice coughing to be sure your lungs are clear. If you have an incision (the cut made at the time of surgery),  support your incision when coughing by placing a pillow or rolled up towels firmly against it. Once you are able to get out of bed, walk around indoors and cough well. You may stop using the incentive spirometer when instructed by your caregiver.  RISKS AND COMPLICATIONS  Take your time so you do not get dizzy or light-headed.  If you are in pain, you may need to take or ask for pain medication before doing incentive spirometry. It is harder to take a deep breath if you  are having pain. AFTER USE  Rest and breathe slowly and easily.  It can be helpful to keep track of a log of your progress. Your caregiver can provide you with a simple table to help with this. If you are using the spirometer at home, follow these instructions: [place] IF:   You are having difficultly using the spirometer.  You have trouble using the spirometer as often as instructed.  Your pain medication is not giving enough relief while using the spirometer.  You develop fever of 100.5 F (38.1 C) or higher. SEEK IMMEDIATE MEDICAL CARE IF:   You cough up bloody sputum that had not been present before.  You develop fever of 102 F (38.9 C) or greater.  You develop worsening pain at or near the incision site. MAKE SURE YOU:   Understand these instructions.  Will watch your condition.  Will get help right away if you are not doing well or get worse. Document Released: 09/04/2006 Document Revised: 07/17/2011 Document Reviewed: 11/05/2006 Exit[employer] Patient Information 2014 [employer], [place].   ________________________________________________________________________  WHAT IS A BLOOD TRANSFUSION? Blood Transfusion Information  A transfusion is the replacement of blood or some of its parts. Blood is made up of multiple cells which provide different functions.  Red blood cells carry oxygen and are used for blood loss replacement.  White blood cells fight against infection.  Platelets control bleeding.  Plasma helps clot blood.  Other blood products are available for specialized needs, such as hemophilia or other clotting disorders. BEFORE THE TRANSFUSION  Who gives blood for transfusions?   Healthy volunteers who are fully evaluated to make sure their blood is safe. This is blood bank blood. Transfusion therapy is the safest it has ever been in the practice of medicine. Before blood is taken from a donor, a complete history is taken to make sure that person has  no history of diseases nor engages in risky social behavior (examples are intravenous drug use or sexual activity with multiple partners). The donor's travel history is screened to minimize risk of transmitting infections, such as malaria. The donated blood is tested for signs of infectious diseases, such as HIV and hepatitis. The blood is then tested to be sure it is compatible with you in order to minimize the chance of a transfusion reaction. If you or a relative donates blood, this is often done in anticipation of surgery and is not appropriate for emergency situations. It takes many days to process the donated blood. RISKS AND COMPLICATIONS Although transfusion therapy is very safe and saves many lives, the main dangers of transfusion include:   Getting an infectious disease.  Developing a transfusion reaction. This is an allergic reaction to something in the blood you were given. Every precaution is taken to prevent this. The decision to have a blood transfusion has been considered carefully by your caregiver before blood is given. Blood is not given unless the benefits outweigh the risks. AFTER THE TRANSFUSION  Right after receiving a blood transfusion, you will usually feel much better and more energetic. This is especially true if your red blood cells have gotten low (anemic). The transfusion raises the level of the red blood cells which carry oxygen, and this usually causes an energy increase.  The nurse administering the transfusion will monitor you carefully for complications. HOME CARE INSTRUCTIONS  No special instructions are needed after a transfusion. You may find your energy is better. Speak with your caregiver about any limitations on activity for underlying diseases you may have. SEEK MEDICAL CARE IF:   Your condition is not improving after your transfusion.  You develop redness or irritation at the intravenous (IV) site. SEEK IMMEDIATE MEDICAL CARE IF:  Any of the following  symptoms occur over the next 12 hours:  Shaking chills.  You have a temperature by mouth above 102 F (38.9 C), not controlled by medicine.  Chest, back, or muscle pain.  People around you feel you are not acting correctly or are confused.  Shortness of breath or difficulty breathing.  Dizziness and fainting.  You get a rash or develop hives.  You have a decrease in urine output.  Your urine turns a dark color or changes to pink, red, or brown. Any of the following symptoms occur over the next 10 days:  You have a temperature by mouth above 102 F (38.9 C), not controlled by medicine.  Shortness of breath.  Weakness after normal activity.  The white part of the eye turns yellow (jaundice).  You have a decrease in the amount of urine or are urinating less often.  Your urine turns a dark color or changes to pink, red, or brown. Document Released: 04/21/2000 Document Revised: 07/17/2011 Document Reviewed: 12/09/2007 [facility] Patient Information 2014 [place], [place].  _______________________________________________________________________

## 2018-07-23 ENCOUNTER — Encounter (HOSPITAL_COMMUNITY)
Admission: RE | Admit: 2018-07-23 | Discharge: 2018-07-23 | Disposition: A | Discharge status: Home / Self Care | Payer: Worker's Compensation | Source: Ambulatory Visit | Attending: Orthopedic Surgery | Admitting: Orthopedic Surgery

## 2018-07-23 ENCOUNTER — Ambulatory Visit (HOSPITAL_COMMUNITY)
Admission: RE | Admit: 2018-07-23 | Discharge: 2018-07-23 | Disposition: A | Discharge status: Home / Self Care | Payer: Worker's Compensation | Source: Ambulatory Visit | Attending: Orthopedic Surgery | Admitting: Orthopedic Surgery

## 2018-07-23 ENCOUNTER — Encounter (HOSPITAL_COMMUNITY): Payer: Self-pay

## 2018-07-23 ENCOUNTER — Other Ambulatory Visit: Payer: Self-pay

## 2018-07-23 DIAGNOSIS — Z01811 Encounter for preprocedural respiratory examination: Secondary | ICD-10-CM | POA: Diagnosis not present

## 2018-07-23 DIAGNOSIS — Z01818 Encounter for other preprocedural examination: Secondary | ICD-10-CM | POA: Insufficient documentation

## 2018-07-23 LAB — COMPREHENSIVE METABOLIC PANEL
ALT: 20 U/L (ref 0–44)
AST: 25 U/L (ref 15–41)
Albumin: 4.7 g/dL (ref 3.5–5.0)
Alkaline Phosphatase: 87 U/L (ref 38–126)
Anion gap: 9 (ref 5–15)
BUN: 13 mg/dL (ref 6–20)
CHLORIDE: 104 mmol/L (ref 98–111)
CO2: 26 mmol/L (ref 22–32)
Calcium: 9.9 mg/dL (ref 8.9–10.3)
Creatinine, Ser: 0.7 mg/dL (ref 0.44–1.00)
GFR calc Af Amer: >60 mL/min (ref >60)
GFR calc non Af Amer: >60 mL/min (ref >60)
Glucose, Bld: 104 mg/dL — ABNORMAL HIGH (ref 70–99)
Potassium: 4.8 mmol/L (ref 3.5–5.1)
Sodium: 139 mmol/L (ref 135–145)
Total Bilirubin: 0.9 mg/dL (ref 0.3–1.2)
Total Protein: 8 g/dL (ref 6.5–8.1)

## 2018-07-23 LAB — CBC WITH DIFFERENTIAL/PLATELET
Abs Immature Granulocytes: 0.01 10*3/uL (ref 0.00–0.07)
Basophils Absolute: 0 10*3/uL (ref 0.0–0.1)
Basophils Relative: 1 %
Eosinophils Absolute: 0 10*3/uL (ref 0.0–0.5)
Eosinophils Relative: 1 %
HCT: 47.6 % — ABNORMAL HIGH (ref 36.0–46.0)
Hemoglobin: 15 g/dL (ref 12.0–15.0)
Immature Granulocytes: 0 %
Lymphocytes Relative: 28 %
Lymphs Abs: 1.7 10*3/uL (ref 0.7–4.0)
MCH: 30.2 pg (ref 26.0–34.0)
MCHC: 31.5 g/dL (ref 30.0–36.0)
MCV: 96 fL (ref 80.0–100.0)
Monocytes Absolute: 0.3 10*3/uL (ref 0.1–1.0)
Monocytes Relative: 6 %
Neutro Abs: 4 10*3/uL (ref 1.7–7.7)
Neutrophils Relative %: 64 %
PLATELETS: 329 10*3/uL (ref 150–400)
RBC: 4.96 MIL/uL (ref 3.87–5.11)
RDW: 11.9 % (ref 11.5–15.5)
WBC: 6.1 10*3/uL (ref 4.0–10.5)
nRBC: 0 % (ref 0.0–0.2)

## 2018-07-23 LAB — APTT: aPTT: 25 seconds (ref 24–36)

## 2018-07-23 LAB — URINALYSIS, ROUTINE W REFLEX MICROSCOPIC
Bilirubin Urine: NEGATIVE
Glucose, UA: NEGATIVE mg/dL
Hgb urine dipstick: NEGATIVE
Ketones, ur: NEGATIVE mg/dL
Leukocytes,Ua: NEGATIVE
Nitrite: NEGATIVE
Protein, ur: NEGATIVE mg/dL
Specific Gravity, Urine: 1.006 (ref 1.005–1.030)
pH: 6 (ref 5.0–8.0)

## 2018-07-23 LAB — PROTIME-INR
INR: 1 (ref 0.8–1.2)
Prothrombin Time: 12.7 seconds (ref 11.4–15.2)

## 2018-07-23 LAB — SURGICAL PCR SCREEN
MRSA, PCR: NEGATIVE
Staphylococcus aureus: NEGATIVE

## 2018-07-23 LAB — ABO/RH: ABO/RH(D): A POS

## 2018-07-25 ENCOUNTER — Inpatient Hospital Stay (HOSPITAL_COMMUNITY)
Admission: RE | Admit: 2018-07-25 | Payer: Worker's Compensation | Source: Other Acute Inpatient Hospital | Admitting: Orthopedic Surgery

## 2018-07-25 ENCOUNTER — Encounter (HOSPITAL_COMMUNITY): Admission: RE | Payer: Self-pay | Source: Other Acute Inpatient Hospital

## 2018-07-25 LAB — TYPE AND SCREEN
ABO/RH(D): A POS
Antibody Screen: NEGATIVE

## 2018-07-25 SURGERY — ARTHROPLASTY, SHOULDER, TOTAL
Anesthesia: Choice | Laterality: Right

## 2018-08-29 ENCOUNTER — Inpatient Hospital Stay (HOSPITAL_COMMUNITY): Admission: RE | Admit: 2018-08-29 | Payer: Self-pay | Source: Home / Self Care | Admitting: Orthopedic Surgery

## 2018-08-29 ENCOUNTER — Encounter (HOSPITAL_COMMUNITY): Admission: RE | Payer: Self-pay | Source: Home / Self Care

## 2018-08-29 SURGERY — ARTHROPLASTY, SHOULDER, TOTAL
Anesthesia: Choice | Laterality: Right

## 2018-09-09 ENCOUNTER — Other Ambulatory Visit: Payer: Self-pay | Admitting: Orthopedic Surgery

## 2018-09-12 ENCOUNTER — Other Ambulatory Visit: Payer: Self-pay | Admitting: Orthopedic Surgery

## 2018-09-12 NOTE — Progress Notes (Signed)
SPOKE W/  PT     SCREENING SYMPTOMS OF COVID 19:   COUGH--NO  SORE THROAT---NO  NASAL CONGESTION----NO  SNEEZING----NO  SHORTNESS OF BREATH---NO  DIFFICULTY BREATHING---NO  TEMP >100.0 -----NO  UNEXPLAINED BODY ACHES------NO  CHILLS -------- NO  HEADACHES ---------NO  LOSS OF SMELL/ TASTE --------No   HAVE YOU OR ANY FAMILY MEMBER TRAVELLED PAST 14 DAYS OUT OF THE   COUNTY---NO STATE----NO COUNTRY----NO  HAVE YOU OR ANY FAMILY MEMBER BEEN EXPOSED TO ANYONE WITH COVID 19? NO

## 2018-09-12 NOTE — Patient Instructions (Addendum)
Erica Fisher  09/12/2018   Your procedure is scheduled on: 09-19-18    Report to [facility] Main  Entrance    Report to Admitting at 8:05 AM    Call this number if you have problems the morning of surgery [phone]    Remember: NO SOLID FOOD AFTER MIDNIGHT THE NIGHT PRIOR TO SURGERY. NOTHING BY MOUTH EXCEPT CLEAR LIQUIDS UNTIL 3 HOURS PRIOR TO SCHEULED SURGERY. PLEASE FINISH ENSURE DRINK PER SURGEON ORDER 3 HOURS PRIOR TO SCHEDULED SURGERY TIME WHICH NEEDS TO BE COMPLETED AT 4:30 AM.   CLEAR LIQUID DIET   Foods Allowed                                                                     Foods Excluded  Coffee and tea, regular and decaf                             liquids that you cannot  Plain Jell-O in any flavor                                             see through such as: Fruit ices (not with fruit pulp)                                     milk, soups, orange juice  Iced Popsicles                                    All solid food Carbonated beverages, regular and diet                                    Cranberry, grape and apple juices Sports drinks like Gatorade Lightly seasoned clear broth or consume(fat free) Sugar, honey syrup  Sample Menu Breakfast                                Lunch                                     Supper Cranberry juice                    Beef broth                            Chicken broth Jell-O                                     Grape juice  Apple juice Coffee or tea                        Jell-O                                      Popsicle                                                Coffee or tea                        Coffee or tea  _____________________________________________________________________     BRUSH YOUR TEETH MORNING OF SURGERY AND RINSE YOUR MOUTH OUT, NO CHEWING GUM CANDY OR MINTS.     Take these medicines the morning of surgery with A SIP OF WATER: None                                 You may not have any metal on your body including hair pins and              piercings  Do not wear jewelry, make-up, lotions, powders or perfumes, deodorant             Do not wear nail polish.  Do not shave  48 hours prior to surgery.     Do not bring valuables to the hospital. [facility] IS NOT             RESPONSIBLE   FOR VALUABLES.  Contacts, dentures or bridgework may not be worn into surgery.  Leave suitcase in the car. After surgery it may be brought to your room.   Special Instructions: N/A              Please read over the following fact sheets you were given: _____________________________________________________________________             [facility] Pre[facility] Before surgery, you can play an important role.  Because skin is not sterile, your skin needs to be as free of germs as possible.  You can reduce the number of germs on your skin by washing with CHG (chlorahexidine gluconate) soap before surgery.  CHG is an antiseptic cleaner which kills germs and bonds with the skin to continue killing germs even after washing. Please DO NOT use if you have an allergy to CHG or antibacterial soaps.  If your skin becomes reddened/irritated stop using the CHG and inform your nurse when you arrive at Short Stay. Do not shave (including legs and underarms) for at least 48 hours prior to the first CHG shower.  You may shave your face/neck. Please follow these instructions carefully:  1.  Shower with CHG Soap the night before surgery and the  morning of Surgery.  2.  If you choose to wash your hair, wash your hair first as usual with your  normal  shampoo.  3.  After you shampoo, rinse your hair and body thoroughly to remove the  shampoo.                           4.  Use CHG  as you would any other liquid soap.  You can apply chg directly  to the skin and wash                       Gently with a scrungie or clean washcloth.  5.  Apply the CHG Soap to  your body ONLY FROM THE NECK DOWN.   Do not use on face/ open                           Wound or open sores. Avoid contact with eyes, ears mouth and genitals (private parts).                       Wash face,  Genitals (private parts) with your normal soap.             6.  Wash thoroughly, paying special attention to the area where your surgery  will be performed.  7.  Thoroughly rinse your body with warm water from the neck down.  8.  DO NOT shower/wash with your normal soap after using and rinsing off  the CHG Soap.                9.  Pat yourself dry with a clean towel.            10.  Wear clean pajamas.            11.  Place clean sheets on your bed the night of your first shower and do not  sleep with pets. Day of Surgery : Do not apply any lotions/deodorants the morning of surgery.  Please wear clean clothes to the hospital/surgery center.  FAILURE TO FOLLOW THESE INSTRUCTIONS MAY RESULT IN THE CANCELLATION OF YOUR SURGERY PATIENT SIGNATURE_________________________________  NURSE SIGNATURE__________________________________  ________________________________________________________________________   Erica Fisher  An incentive spirometer is a tool that can help keep your lungs clear and active. This tool measures how well you are filling your lungs with each breath. Taking long deep breaths may help reverse or decrease the chance of developing breathing (pulmonary) problems (especially infection) following:  A long period of time when you are unable to move or be active. BEFORE THE PROCEDURE   If the spirometer includes an indicator to show your best effort, your nurse or respiratory therapist will set it to a desired goal.  If possible, sit up straight or lean slightly forward. Try not to slouch.  Hold the incentive spirometer in an upright position. INSTRUCTIONS FOR USE  1. Sit on the edge of your bed if possible, or sit up as far as you can in bed or on a  chair. 2. Hold the incentive spirometer in an upright position. 3. Breathe out normally. 4. Place the mouthpiece in your mouth and seal your lips tightly around it. 5. Breathe in slowly and as deeply as possible, raising the piston or the ball toward the top of the column. 6. Hold your breath for 3-5 seconds or for as long as possible. Allow the piston or ball to fall to the bottom of the column. 7. Remove the mouthpiece from your mouth and breathe out normally. 8. Rest for a few seconds and repeat Steps 1 through 7 at least 10 times every 1-2 hours when you are awake. Take your time and take a few normal breaths between deep breaths. 9. The spirometer may include an indicator to show your best effort.  Use the indicator as a goal to work toward during each repetition. 10. After each set of 10 deep breaths, practice coughing to be sure your lungs are clear. If you have an incision (the cut made at the time of surgery), support your incision when coughing by placing a pillow or rolled up towels firmly against it. Once you are able to get out of bed, walk around indoors and cough well. You may stop using the incentive spirometer when instructed by your caregiver.  RISKS AND COMPLICATIONS  Take your time so you do not get dizzy or light-headed.  If you are in pain, you may need to take or ask for pain medication before doing incentive spirometry. It is harder to take a deep breath if you are having pain. AFTER USE  Rest and breathe slowly and easily.  It can be helpful to keep track of a log of your progress. Your caregiver can provide you with a simple table to help with this. If you are using the spirometer at home, follow these instructions: [place] IF:   You are having difficultly using the spirometer.  You have trouble using the spirometer as often as instructed.  Your pain medication is not giving enough relief while using the spirometer.  You develop fever of 100.5 F  (38.1 C) or higher. SEEK IMMEDIATE MEDICAL CARE IF:   You cough up bloody sputum that had not been present before.  You develop fever of 102 F (38.9 C) or greater.  You develop worsening pain at or near the incision site. MAKE SURE YOU:   Understand these instructions.  Will watch your condition.  Will get help right away if you are not doing well or get worse. Document Released: 09/04/2006 Document Revised: 07/17/2011 Document Reviewed: 11/05/2006 Exit[employer] Patient Information 2014 [employer], [place].   ________________________________________________________________________  WHAT IS A BLOOD TRANSFUSION? Blood Transfusion Information  A transfusion is the replacement of blood or some of its parts. Blood is made up of multiple cells which provide different functions.  Red blood cells carry oxygen and are used for blood loss replacement.  White blood cells fight against infection.  Platelets control bleeding.  Plasma helps clot blood.  Other blood products are available for specialized needs, such as hemophilia or other clotting disorders. BEFORE THE TRANSFUSION  Who gives blood for transfusions?   Healthy volunteers who are fully evaluated to make sure their blood is safe. This is blood bank blood. Transfusion therapy is the safest it has ever been in the practice of medicine. Before blood is taken from a donor, a complete history is taken to make sure that person has no history of diseases nor engages in risky social behavior (examples are intravenous drug use or sexual activity with multiple partners). The donor's travel history is screened to minimize risk of transmitting infections, such as malaria. The donated blood is tested for signs of infectious diseases, such as HIV and hepatitis. The blood is then tested to be sure it is compatible with you in order to minimize the chance of a transfusion reaction. If you or a relative donates blood, this is often done in anticipation  of surgery and is not appropriate for emergency situations. It takes many days to process the donated blood. RISKS AND COMPLICATIONS Although transfusion therapy is very safe and saves many lives, the main dangers of transfusion include:   Getting an infectious disease.  Developing a transfusion reaction. This is an allergic reaction to something in the  blood you were given. Every precaution is taken to prevent this. The decision to have a blood transfusion has been considered carefully by your caregiver before blood is given. Blood is not given unless the benefits outweigh the risks. AFTER THE TRANSFUSION  Right after receiving a blood transfusion, you will usually feel much better and more energetic. This is especially true if your red blood cells have gotten low (anemic). The transfusion raises the level of the red blood cells which carry oxygen, and this usually causes an energy increase.  The nurse administering the transfusion will monitor you carefully for complications. HOME CARE INSTRUCTIONS  No special instructions are needed after a transfusion. You may find your energy is better. Speak with your caregiver about any limitations on activity for underlying diseases you may have. SEEK MEDICAL CARE IF:   Your condition is not improving after your transfusion.  You develop redness or irritation at the intravenous (IV) site. SEEK IMMEDIATE MEDICAL CARE IF:  Any of the following symptoms occur over the next 12 hours:  Shaking chills.  You have a temperature by mouth above 102 F (38.9 C), not controlled by medicine.  Chest, back, or muscle pain.  People around you feel you are not acting correctly or are confused.  Shortness of breath or difficulty breathing.  Dizziness and fainting.  You get a rash or develop hives.  You have a decrease in urine output.  Your urine turns a dark color or changes to pink, red, or brown. Any of the following symptoms occur over the next 10  days:  You have a temperature by mouth above 102 F (38.9 C), not controlled by medicine.  Shortness of breath.  Weakness after normal activity.  The white part of the eye turns yellow (jaundice).  You have a decrease in the amount of urine or are urinating less often.  Your urine turns a dark color or changes to pink, red, or brown. Document Released: 04/21/2000 Document Revised: 07/17/2011 Document Reviewed: 12/09/2007 [facility] Patient Information 2014 [place], [place].  _______________________________________________________________________

## 2018-09-12 NOTE — Progress Notes (Signed)
07-23-18 (Epic) EKG, and CXR

## 2018-09-12 NOTE — Progress Notes (Signed)
Please place surgical orders. Pt is scheduled for her PAT visit on 09-13-18. Thank you.

## 2018-09-13 ENCOUNTER — Other Ambulatory Visit: Payer: Self-pay

## 2018-09-13 ENCOUNTER — Encounter (HOSPITAL_COMMUNITY)
Admission: RE | Admit: 2018-09-13 | Discharge: 2018-09-13 | Disposition: A | Discharge status: Home / Self Care | Payer: Worker's Compensation | Source: Ambulatory Visit | Attending: Orthopedic Surgery | Admitting: Orthopedic Surgery

## 2018-09-13 ENCOUNTER — Encounter (HOSPITAL_COMMUNITY): Payer: Self-pay

## 2018-09-13 DIAGNOSIS — E785 Hyperlipidemia, unspecified: Secondary | ICD-10-CM | POA: Insufficient documentation

## 2018-09-13 DIAGNOSIS — K219 Gastro-esophageal reflux disease without esophagitis: Secondary | ICD-10-CM | POA: Insufficient documentation

## 2018-09-13 DIAGNOSIS — M879 Osteonecrosis, unspecified: Secondary | ICD-10-CM | POA: Diagnosis not present

## 2018-09-13 DIAGNOSIS — I341 Nonrheumatic mitral (valve) prolapse: Secondary | ICD-10-CM | POA: Insufficient documentation

## 2018-09-13 DIAGNOSIS — Z01818 Encounter for other preprocedural examination: Secondary | ICD-10-CM | POA: Diagnosis not present

## 2018-09-13 DIAGNOSIS — Z8781 Personal history of (healed) traumatic fracture: Secondary | ICD-10-CM | POA: Diagnosis not present

## 2018-09-13 LAB — CBC WITH DIFFERENTIAL/PLATELET
Abs Immature Granulocytes: 0 10*3/uL (ref 0.00–0.07)
Basophils Absolute: 0.1 10*3/uL (ref 0.0–0.1)
Basophils Relative: 1 %
Eosinophils Absolute: 0.3 10*3/uL (ref 0.0–0.5)
Eosinophils Relative: 4 %
HCT: 43.7 % (ref 36.0–46.0)
Hemoglobin: 14.4 g/dL (ref 12.0–15.0)
Immature Granulocytes: 0 %
Lymphocytes Relative: 29 %
Lymphs Abs: 1.7 10*3/uL (ref 0.7–4.0)
MCH: 30.9 pg (ref 26.0–34.0)
MCHC: 33 g/dL (ref 30.0–36.0)
MCV: 93.8 fL (ref 80.0–100.0)
Monocytes Absolute: 0.4 10*3/uL (ref 0.1–1.0)
Monocytes Relative: 6 %
Neutro Abs: 3.4 10*3/uL (ref 1.7–7.7)
Neutrophils Relative %: 60 %
Platelets: 323 10*3/uL (ref 150–400)
RBC: 4.66 MIL/uL (ref 3.87–5.11)
RDW: 12.2 % (ref 11.5–15.5)
WBC: 5.7 10*3/uL (ref 4.0–10.5)
nRBC: 0 % (ref 0.0–0.2)

## 2018-09-13 LAB — PROTIME-INR
INR: 1.1 (ref 0.8–1.2)
Prothrombin Time: 13.6 seconds (ref 11.4–15.2)

## 2018-09-13 LAB — URINALYSIS, ROUTINE W REFLEX MICROSCOPIC
Bacteria, UA: NONE SEEN
Bilirubin Urine: NEGATIVE
Glucose, UA: NEGATIVE mg/dL
Hgb urine dipstick: NEGATIVE
Ketones, ur: 80 mg/dL — AB
Nitrite: NEGATIVE
Protein, ur: NEGATIVE mg/dL
Specific Gravity, Urine: 1.013 (ref 1.005–1.030)
pH: 5 (ref 5.0–8.0)

## 2018-09-13 LAB — COMPREHENSIVE METABOLIC PANEL
ALT: 33 U/L (ref 0–44)
AST: 37 U/L (ref 15–41)
Albumin: 4.5 g/dL (ref 3.5–5.0)
Alkaline Phosphatase: 69 U/L (ref 38–126)
Anion gap: 13 (ref 5–15)
BUN: 13 mg/dL (ref 6–20)
CO2: 25 mmol/L (ref 22–32)
Calcium: 9.7 mg/dL (ref 8.9–10.3)
Chloride: 101 mmol/L (ref 98–111)
Creatinine, Ser: 0.69 mg/dL (ref 0.44–1.00)
GFR calc Af Amer: >60 mL/min (ref >60)
GFR calc non Af Amer: >60 mL/min (ref >60)
Glucose, Bld: 80 mg/dL (ref 70–99)
Potassium: 5.1 mmol/L (ref 3.5–5.1)
Sodium: 139 mmol/L (ref 135–145)
Total Bilirubin: 0.9 mg/dL (ref 0.3–1.2)
Total Protein: 7.2 g/dL (ref 6.5–8.1)

## 2018-09-13 LAB — APTT: aPTT: 27 seconds (ref 24–36)

## 2018-09-13 LAB — SURGICAL PCR SCREEN
MRSA, PCR: NEGATIVE
Staphylococcus aureus: NEGATIVE

## 2018-09-16 NOTE — Progress Notes (Signed)
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 Jones Broom.   Urinalysis routed to Dr. Ave Filter.   COVID19 swab ordered, pending.   Pt can proceed with planned procedure barring acute status change.  VS: BP (!) 147/71 (BP Location: Right Arm)   Pulse 62   Temp 36.8 C (Oral)   Resp 18   Ht 5\' 5"  (1.651 m)   Wt 81.3 kg   SpO2 99%   BMI 29.83 kg/m   PROVIDERS: Maia Plan, MD is PCP    LABS: Labs reviewed: Acceptable for surgery. (all labs ordered are listed, but only abnormal results are displayed)  Labs Reviewed  URINALYSIS, ROUTINE W REFLEX MICROSCOPIC - Abnormal; Notable for the following components:      Result Value   Ketones, ur 80 (*)    Leukocytes,Ua SMALL (*)    All other components within normal limits  SURGICAL PCR SCREEN  APTT  CBC WITH DIFFERENTIAL/PLATELET  COMPREHENSIVE METABOLIC PANEL  PROTIME-INR     IMAGES: Chest Xray 07/23/2018 FINDINGS: Mediastinum and hilar structures normal. Lungs are clear. No pleural effusion or pneumothorax. Heart size normal. Postsurgical changes right shoulder. Thoracic spine scoliosis. Degenerative change thoracic spine.  IMPRESSION: No acute cardiopulmonary disease. Postsurgical changes right shoulder.  EKG: 07/23/2018 Rate 69 bpm Normal sinus rhythm  Normal ECG No previous tracing   CV:  Past Medical History:  Diagnosis Date  . GERD (gastroesophageal reflux disease)   . Hyperlipemia   . MVP (mitral valve prolapse)   . PONV (postoperative nausea  and vomiting)     Past Surgical History:  Procedure Laterality Date  . ABDOMINAL HYSTERECTOMY    . CHOLECYSTECTOMY    . FOOT NEUROMA SURGERY    . hand surg Left    several reconstructive surgeries following accident  . ORIF HUMERUS FRACTURE Right 04/28/2015   Procedure: OPEN TREATMENT OF RIGHT PROXIMAL HUMERUS FRACTURE;  Surgeon: Mack Hook, MD;  Location: [facility];  Service: Orthopedics;  Laterality: Right;    MEDICATIONS: . Biotin 1000 MCG tablet  . Cholecalciferol (VITAMIN D3) 125 MCG (5000 UT) CAPS  . Multiple Vitamins-Minerals (MULTIVITAMIN WITH MINERALS) tablet  . Omega-3 Fatty Acids (FISH OIL) 1000 MG CAPS  . Turmeric 500 MG CAPS   No current facility-administered medications for this encounter.     Janey Genta [facility] Pre-Surgical Testing [phone] 09/16/18 10:37 AM

## 2018-09-16 NOTE — Anesthesia Preprocedure Evaluation (Addendum)
Anesthesia Evaluation  Patient identified by MRN, date of birth, ID band Patient awake    Reviewed: Allergy & Precautions, NPO status , Patient's Chart, lab work & pertinent test results  History of Anesthesia Complications (+) PONV and history of anesthetic complications  Airway Mallampati: II  TM Distance: >3 FB Neck ROM: Full    Dental no notable dental hx.    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  Reproductive/Obstetrics                            Anesthesia Physical Anesthesia Plan  ASA: II  Anesthesia Plan: Regional and General   Post-op Pain Management: GA combined w/ Regional for post-op pain   Induction: Intravenous  PONV Risk Score and Plan: 4 or greater and Ondansetron, Dexamethasone, Midazolam and Treatment may vary due to age or medical condition  Airway Management Planned: Oral ETT  Additional Equipment:   Intra-op Plan:   Post-operative Plan: Extubation in OR  Informed Consent: I have reviewed the patients History and Physical, chart, labs and discussed the procedure including the risks, benefits and alternatives for the proposed anesthesia with the patient or authorized representative who has indicated his/her understanding and acceptance.     Dental advisory given  Plan Discussed with: CRNA  Anesthesia Plan Comments: (Reviewed PAT note  09/13/18, Jodell Cipro, PA-C)       Anesthesia Quick Evaluation

## 2018-09-17 ENCOUNTER — Other Ambulatory Visit: Payer: Self-pay

## 2018-09-17 ENCOUNTER — Other Ambulatory Visit (HOSPITAL_COMMUNITY)
Admission: RE | Admit: 2018-09-17 | Discharge: 2018-09-17 | Disposition: A | Discharge status: Home / Self Care | Payer: Worker's Compensation | Source: Ambulatory Visit | Attending: Orthopedic Surgery | Admitting: Orthopedic Surgery

## 2018-09-17 DIAGNOSIS — Z1159 Encounter for screening for other viral diseases: Secondary | ICD-10-CM | POA: Insufficient documentation

## 2018-09-18 LAB — NOVEL CORONAVIRUS, NAA (HOSP ORDER, SEND-OUT TO REF LAB; TAT 18-24 HRS): SARS-CoV-2, NAA: NOT DETECTED

## 2018-09-19 ENCOUNTER — Inpatient Hospital Stay (HOSPITAL_COMMUNITY): Payer: Worker's Compensation | Admitting: Certified Registered Nurse Anesthetist

## 2018-09-19 ENCOUNTER — Encounter (HOSPITAL_COMMUNITY): Payer: Self-pay | Admitting: *Deleted

## 2018-09-19 ENCOUNTER — Inpatient Hospital Stay (HOSPITAL_COMMUNITY)
Admission: RE | Admit: 2018-09-19 | Discharge: 2018-09-20 | DRG: 483 | Disposition: A | Discharge status: Home / Self Care | Payer: Worker's Compensation | Attending: Orthopedic Surgery | Admitting: Orthopedic Surgery

## 2018-09-19 ENCOUNTER — Other Ambulatory Visit: Payer: Self-pay

## 2018-09-19 ENCOUNTER — Encounter (HOSPITAL_COMMUNITY)
Admission: RE | Disposition: A | Discharge status: Home / Self Care | Payer: Self-pay | Source: Home / Self Care | Attending: Orthopedic Surgery

## 2018-09-19 ENCOUNTER — Inpatient Hospital Stay (HOSPITAL_COMMUNITY): Payer: Worker's Compensation

## 2018-09-19 ENCOUNTER — Inpatient Hospital Stay (HOSPITAL_COMMUNITY): Payer: Worker's Compensation | Admitting: Physician Assistant

## 2018-09-19 DIAGNOSIS — M25511 Pain in right shoulder: Secondary | ICD-10-CM | POA: Diagnosis present

## 2018-09-19 DIAGNOSIS — E785 Hyperlipidemia, unspecified: Secondary | ICD-10-CM | POA: Diagnosis present

## 2018-09-19 DIAGNOSIS — Z881 Allergy status to other antibiotic agents status: Secondary | ICD-10-CM

## 2018-09-19 DIAGNOSIS — Z9049 Acquired absence of other specified parts of digestive tract: Secondary | ICD-10-CM

## 2018-09-19 DIAGNOSIS — Z885 Allergy status to narcotic agent status: Secondary | ICD-10-CM

## 2018-09-19 DIAGNOSIS — M87811 Other osteonecrosis, right shoulder: Secondary | ICD-10-CM | POA: Diagnosis not present

## 2018-09-19 DIAGNOSIS — Z888 Allergy status to other drugs, medicaments and biological substances status: Secondary | ICD-10-CM | POA: Diagnosis not present

## 2018-09-19 DIAGNOSIS — K219 Gastro-esophageal reflux disease without esophagitis: Secondary | ICD-10-CM | POA: Diagnosis present

## 2018-09-19 DIAGNOSIS — Z79899 Other long term (current) drug therapy: Secondary | ICD-10-CM

## 2018-09-19 DIAGNOSIS — I341 Nonrheumatic mitral (valve) prolapse: Secondary | ICD-10-CM | POA: Diagnosis present

## 2018-09-19 DIAGNOSIS — Z96619 Presence of unspecified artificial shoulder joint: Secondary | ICD-10-CM

## 2018-09-19 DIAGNOSIS — Z90710 Acquired absence of both cervix and uterus: Secondary | ICD-10-CM

## 2018-09-19 DIAGNOSIS — Z882 Allergy status to sulfonamides status: Secondary | ICD-10-CM | POA: Diagnosis not present

## 2018-09-19 DIAGNOSIS — Z1159 Encounter for screening for other viral diseases: Secondary | ICD-10-CM | POA: Diagnosis not present

## 2018-09-19 DIAGNOSIS — Z880 Allergy status to penicillin: Secondary | ICD-10-CM

## 2018-09-19 HISTORY — PX: TOTAL SHOULDER ARTHROPLASTY: SHX126

## 2018-09-19 HISTORY — PX: MINOR HARDWARE REMOVAL: SHX6474

## 2018-09-19 SURGERY — ARTHROPLASTY, SHOULDER, TOTAL
Anesthesia: Regional | Site: Shoulder | Laterality: Right

## 2018-09-19 MED ORDER — FENTANYL CITRATE (PF) 100 MCG/2ML IJ SOLN: 25.0000 ug | INTRAMUSCULAR | Status: DC | PRN

## 2018-09-19 MED ORDER — ONDANSETRON HCL 4 MG/2ML IJ SOLN
4.0000 mg | Freq: Four times a day (QID) | INTRAMUSCULAR | Status: DC | PRN
  Administered 2018-09-19: 4 mg via INTRAVENOUS
  Filled 2018-09-19: qty 2

## 2018-09-19 MED ORDER — LIDOCAINE 2% (20 MG/ML) 5 ML SYRINGE
INTRAMUSCULAR | Status: AC
  Filled 2018-09-19: qty 5

## 2018-09-19 MED ORDER — OXYCODONE HCL 5 MG PO TABS
5.0000 mg | ORAL_TABLET | ORAL | Status: DC | PRN
  Administered 2018-09-20 (×2): 5 mg via ORAL
  Filled 2018-09-19 (×2): qty 1

## 2018-09-19 MED ORDER — PHENOL 1.4 % MT LIQD: 1.0000 | OROMUCOSAL | Status: DC | PRN

## 2018-09-19 MED ORDER — EPHEDRINE SULFATE-NACL 50-0.9 MG/10ML-% IV SOSY
PREFILLED_SYRINGE | INTRAVENOUS | Status: DC | PRN
  Administered 2018-09-19: 5 mg via INTRAVENOUS
  Administered 2018-09-19: 10 mg via INTRAVENOUS

## 2018-09-19 MED ORDER — ROCURONIUM BROMIDE 10 MG/ML (PF) SYRINGE
PREFILLED_SYRINGE | INTRAVENOUS | Status: AC
  Filled 2018-09-19: qty 10

## 2018-09-19 MED ORDER — ALUM & MAG HYDROXIDE-SIMETH 200-200-20 MG/5ML PO SUSP: 30.0000 mL | ORAL | Status: DC | PRN

## 2018-09-19 MED ORDER — ACETAMINOPHEN 500 MG PO TABS
1000.0000 mg | ORAL_TABLET | Freq: Once | ORAL | Status: AC
  Administered 2018-09-19: 1000 mg via ORAL
  Filled 2018-09-19: qty 2

## 2018-09-19 MED ORDER — SODIUM CHLORIDE 0.9 % IV SOLN
125.0000 mL/h | INTRAVENOUS | Status: DC
  Administered 2018-09-19 – 2018-09-20 (×2): 125 mL/h via INTRAVENOUS

## 2018-09-19 MED ORDER — POLYETHYLENE GLYCOL 3350 17 G PO PACK: 17.0000 g | PACK | Freq: Every day | ORAL | Status: DC | PRN

## 2018-09-19 MED ORDER — FENTANYL CITRATE (PF) 100 MCG/2ML IJ SOLN
50.0000 ug | Freq: Once | INTRAMUSCULAR | Status: DC
  Filled 2018-09-19: qty 2

## 2018-09-19 MED ORDER — VANCOMYCIN HCL IN DEXTROSE 1-5 GM/200ML-% IV SOLN
1000.0000 mg | Freq: Two times a day (BID) | INTRAVENOUS | Status: AC
  Administered 2018-09-19: 1000 mg via INTRAVENOUS
  Filled 2018-09-19: qty 200

## 2018-09-19 MED ORDER — DIPHENHYDRAMINE HCL 12.5 MG/5ML PO ELIX: 12.5000 mg | ORAL_SOLUTION | ORAL | Status: DC | PRN

## 2018-09-19 MED ORDER — METOCLOPRAMIDE HCL 5 MG/ML IJ SOLN
5.0000 mg | Freq: Three times a day (TID) | INTRAMUSCULAR | Status: DC | PRN
  Administered 2018-09-20: 10 mg via INTRAVENOUS
  Filled 2018-09-19: qty 2

## 2018-09-19 MED ORDER — PROPOFOL 10 MG/ML IV BOLUS
INTRAVENOUS | Status: DC | PRN
  Administered 2018-09-19: 200 mg via INTRAVENOUS

## 2018-09-19 MED ORDER — STERILE WATER FOR IRRIGATION IR SOLN
Status: DC | PRN
  Administered 2018-09-19: 3000 mL

## 2018-09-19 MED ORDER — ACETAMINOPHEN 325 MG PO TABS: 325.0000 mg | ORAL_TABLET | Freq: Four times a day (QID) | ORAL | Status: DC | PRN

## 2018-09-19 MED ORDER — DOCUSATE SODIUM 100 MG PO CAPS
100.0000 mg | ORAL_CAPSULE | Freq: Two times a day (BID) | ORAL | Status: DC
  Administered 2018-09-20: 100 mg via ORAL
  Filled 2018-09-19 (×2): qty 1

## 2018-09-19 MED ORDER — MIDAZOLAM HCL 2 MG/2ML IJ SOLN
1.0000 mg | Freq: Once | INTRAMUSCULAR | Status: AC
  Administered 2018-09-19: 2 mg via INTRAVENOUS
  Filled 2018-09-19: qty 2

## 2018-09-19 MED ORDER — LIDOCAINE 2% (20 MG/ML) 5 ML SYRINGE
INTRAMUSCULAR | Status: DC | PRN
  Administered 2018-09-19: 50 mg via INTRAVENOUS

## 2018-09-19 MED ORDER — METHOCARBAMOL 500 MG IVPB - SIMPLE MED
500.0000 mg | Freq: Four times a day (QID) | INTRAVENOUS | Status: DC | PRN
  Filled 2018-09-19: qty 50

## 2018-09-19 MED ORDER — BUPIVACAINE HCL (PF) 0.5 % IJ SOLN
INTRAMUSCULAR | Status: DC | PRN
  Administered 2018-09-19: 15 mL via PERINEURAL

## 2018-09-19 MED ORDER — ONDANSETRON HCL 4 MG/2ML IJ SOLN
INTRAMUSCULAR | Status: AC
  Filled 2018-09-19: qty 2

## 2018-09-19 MED ORDER — FLEET ENEMA 7-19 GM/118ML RE ENEM: 1.0000 | ENEMA | Freq: Once | RECTAL | Status: DC | PRN

## 2018-09-19 MED ORDER — OXYCODONE HCL 5 MG PO TABS: 10.0000 mg | ORAL_TABLET | ORAL | Status: DC | PRN

## 2018-09-19 MED ORDER — SODIUM CHLORIDE 0.9 % IR SOLN
Status: DC | PRN
  Administered 2018-09-19: 1000 mL

## 2018-09-19 MED ORDER — BISACODYL 5 MG PO TBEC: 5.0000 mg | DELAYED_RELEASE_TABLET | Freq: Every day | ORAL | Status: DC | PRN

## 2018-09-19 MED ORDER — SUGAMMADEX SODIUM 200 MG/2ML IV SOLN
INTRAVENOUS | Status: DC | PRN
  Administered 2018-09-19: 200 mg via INTRAVENOUS

## 2018-09-19 MED ORDER — CHLORHEXIDINE GLUCONATE 4 % EX LIQD: 60.0000 mL | Freq: Once | CUTANEOUS | Status: DC

## 2018-09-19 MED ORDER — ONDANSETRON HCL 4 MG/2ML IJ SOLN
INTRAMUSCULAR | Status: DC | PRN
  Administered 2018-09-19 (×2): 4 mg via INTRAVENOUS

## 2018-09-19 MED ORDER — VANCOMYCIN HCL IN DEXTROSE 1-5 GM/200ML-% IV SOLN
1000.0000 mg | INTRAVENOUS | Status: AC
  Administered 2018-09-19: 1000 mg via INTRAVENOUS
  Filled 2018-09-19: qty 200

## 2018-09-19 MED ORDER — ZOLPIDEM TARTRATE 5 MG PO TABS: 5.0000 mg | ORAL_TABLET | Freq: Every evening | ORAL | Status: DC | PRN

## 2018-09-19 MED ORDER — METOCLOPRAMIDE HCL 5 MG PO TABS: 5.0000 mg | ORAL_TABLET | Freq: Three times a day (TID) | ORAL | Status: DC | PRN

## 2018-09-19 MED ORDER — METHOCARBAMOL 500 MG PO TABS
500.0000 mg | ORAL_TABLET | Freq: Four times a day (QID) | ORAL | Status: DC | PRN
  Administered 2018-09-20: 500 mg via ORAL
  Filled 2018-09-19: qty 1

## 2018-09-19 MED ORDER — ONDANSETRON HCL 4 MG/2ML IJ SOLN: 4.0000 mg | Freq: Once | INTRAMUSCULAR | Status: DC | PRN

## 2018-09-19 MED ORDER — MENTHOL 3 MG MT LOZG
1.0000 | LOZENGE | OROMUCOSAL | Status: DC | PRN
  Filled 2018-09-19: qty 9

## 2018-09-19 MED ORDER — ROCURONIUM BROMIDE 50 MG/5ML IV SOSY
PREFILLED_SYRINGE | INTRAVENOUS | Status: DC | PRN
  Administered 2018-09-19: 40 mg via INTRAVENOUS
  Administered 2018-09-19 (×2): 10 mg via INTRAVENOUS

## 2018-09-19 MED ORDER — ONDANSETRON HCL 4 MG PO TABS: 4.0000 mg | ORAL_TABLET | Freq: Four times a day (QID) | ORAL | Status: DC | PRN

## 2018-09-19 MED ORDER — DEXAMETHASONE SODIUM PHOSPHATE 10 MG/ML IJ SOLN
INTRAMUSCULAR | Status: AC
  Filled 2018-09-19: qty 1

## 2018-09-19 MED ORDER — LACTATED RINGERS IV SOLN
INTRAVENOUS | Status: DC
  Administered 2018-09-19 (×2): via INTRAVENOUS

## 2018-09-19 MED ORDER — OXYCODONE-ACETAMINOPHEN 5-325 MG PO TABS: 1.0000 | ORAL_TABLET | ORAL | 0 refills | Status: AC | PRN

## 2018-09-19 MED ORDER — METHOCARBAMOL 500 MG PO TABS: 500.0000 mg | ORAL_TABLET | Freq: Three times a day (TID) | ORAL | 2 refills | Status: AC | PRN

## 2018-09-19 MED ORDER — ASPIRIN EC 325 MG PO TBEC
325.0000 mg | DELAYED_RELEASE_TABLET | Freq: Two times a day (BID) | ORAL | Status: DC
  Administered 2018-09-20: 325 mg via ORAL
  Filled 2018-09-19: qty 1

## 2018-09-19 MED ORDER — BUPIVACAINE LIPOSOME 1.3 % IJ SUSP
INTRAMUSCULAR | Status: DC | PRN
  Administered 2018-09-19: 10 mL via PERINEURAL

## 2018-09-19 MED ORDER — TRANEXAMIC ACID-NACL 1000-0.7 MG/100ML-% IV SOLN
1000.0000 mg | INTRAVENOUS | Status: AC
  Administered 2018-09-19: 11:00:00 1000 mg via INTRAVENOUS
  Filled 2018-09-19: qty 100

## 2018-09-19 MED ORDER — DEXAMETHASONE SODIUM PHOSPHATE 10 MG/ML IJ SOLN
INTRAMUSCULAR | Status: DC | PRN
  Administered 2018-09-19: 10 mg via INTRAVENOUS

## 2018-09-19 SURGICAL SUPPLY — 72 items
BAG ZIPLOCK 12X15 (MISCELLANEOUS) ×2 IMPLANT
BIT DRILL 1.6MX128 (BIT) ×2 IMPLANT
BIT DRILL 2.4X128 (BIT) IMPLANT
BLADE SAW SAG 73X25 THK (BLADE) ×1
BLADE SAW SGTL 18X1.27X75 (BLADE) IMPLANT
BLADE SAW SGTL 73X25 THK (BLADE) ×1 IMPLANT
CEMENT BONE DEPUY (Cement) ×2 IMPLANT
CHLORAPREP W/TINT 26 (MISCELLANEOUS) ×4 IMPLANT
CLEANER TIP ELECTROSURG 2X2 (MISCELLANEOUS) ×2 IMPLANT
CLSR STERI-STRIP ANTIMIC 1/2X4 (GAUZE/BANDAGES/DRESSINGS) ×2 IMPLANT
COOLER ICEMAN CLASSIC (MISCELLANEOUS) ×2 IMPLANT
COVER BACK TABLE 60X90IN (DRAPES) ×2 IMPLANT
COVER SURGICAL LIGHT HANDLE (MISCELLANEOUS) ×2 IMPLANT
COVER WAND RF STERILE (DRAPES) IMPLANT
DRAPE INCISE IOBAN 66X45 STRL (DRAPES) ×2 IMPLANT
DRAPE ORTHO SPLIT 77X108 STRL (DRAPES) ×2
DRAPE POUCH INSTRU U-SHP 10X18 (DRAPES) ×2 IMPLANT
DRAPE SURG 17X11 SM STRL (DRAPES) ×2 IMPLANT
DRAPE SURG ORHT 6 SPLT 77X108 (DRAPES) ×2 IMPLANT
DRAPE U-SHAPE 47X51 STRL (DRAPES) ×2 IMPLANT
DRSG ADAPTIC 3X8 NADH LF (GAUZE/BANDAGES/DRESSINGS) ×2 IMPLANT
DRSG AQUACEL AG ADV 3.5X 6 (GAUZE/BANDAGES/DRESSINGS) ×2 IMPLANT
ELECT BLADE TIP CTD 4 INCH (ELECTRODE) ×2 IMPLANT
ELECT REM PT RETURN 15FT ADLT (MISCELLANEOUS) ×2 IMPLANT
FACESHIELD WRAPAROUND (MASK) ×4 IMPLANT
GLENOID PEG SHOULDER 40MM SML (Shoulder) ×1 IMPLANT
GLOVE BIO SURGEON STRL SZ7 (GLOVE) ×2 IMPLANT
GLOVE BIO SURGEON STRL SZ7.5 (GLOVE) ×4 IMPLANT
GLOVE BIOGEL PI IND STRL 7.0 (GLOVE) ×1 IMPLANT
GLOVE BIOGEL PI IND STRL 8 (GLOVE) ×1 IMPLANT
GLOVE BIOGEL PI INDICATOR 7.0 (GLOVE) ×1
GLOVE BIOGEL PI INDICATOR 8 (GLOVE) ×1
GOWN STRL REUS W/TWL LRG LVL3 (GOWN DISPOSABLE) ×2 IMPLANT
GOWN STRL REUS W/TWL XL LVL3 (GOWN DISPOSABLE) ×2 IMPLANT
GUIDEWIRE GLENOID 2.5X220 (WIRE) ×2 IMPLANT
HANDPIECE INTERPULSE COAX TIP (DISPOSABLE) ×1
HEAD HUM 3.5XHI OFST 15X41 (Joint) ×1 IMPLANT
HEAD HUM AEQUALIS 41X15 (Joint) ×1 IMPLANT
HEMOSTAT SURGICEL 2X14 (HEMOSTASIS) ×2 IMPLANT
HOOD PEEL AWAY FLYTE STAYCOOL (MISCELLANEOUS) ×4 IMPLANT
KIT BASIN OR (CUSTOM PROCEDURE TRAY) ×2 IMPLANT
KIT TURNOVER KIT A (KITS) IMPLANT
MANIFOLD NEPTUNE II (INSTRUMENTS) ×2 IMPLANT
NEEDLE MA TROC 1/2 (NEEDLE) ×2 IMPLANT
NS IRRIG 1000ML POUR BTL (IV SOLUTION) ×2 IMPLANT
PACK SHOULDER (CUSTOM PROCEDURE TRAY) ×2 IMPLANT
PAD COLD SHLDR WRAP-ON (PAD) ×2 IMPLANT
PIN GUIDE 2.5X200 (PIN) ×2 IMPLANT
PROTECTOR NERVE ULNAR (MISCELLANEOUS) ×2 IMPLANT
RETRIEVER SUT HEWSON (MISCELLANEOUS) ×2 IMPLANT
SET HNDPC FAN SPRY TIP SCT (DISPOSABLE) ×1 IMPLANT
SHOULDER GLENOID PEG 40MM SML (Shoulder) ×2 IMPLANT
SLING ARM IMMOBILIZER LRG (SOFTGOODS) ×2 IMPLANT
SMARTMIX MINI TOWER (MISCELLANEOUS)
SPONGE LAP 18X18 RF (DISPOSABLE) ×2 IMPLANT
STAPLER VISISTAT 35W (STAPLE) IMPLANT
STEM HUM AEQUALIS PF SZ1A 66 (Stem) ×2 IMPLANT
STRIP CLOSURE SKIN 1/2X4 (GAUZE/BANDAGES/DRESSINGS) ×2 IMPLANT
SUCTION FRAZIER HANDLE 12FR (TUBING) ×1
SUCTION TUBE FRAZIER 12FR DISP (TUBING) ×1 IMPLANT
SUPPORT WRAP ARM LG (MISCELLANEOUS) ×2 IMPLANT
SUT ETHIBOND 2 V 37 (SUTURE) ×2 IMPLANT
SUT MNCRL AB 4-0 PS2 18 (SUTURE) ×2 IMPLANT
SUT VIC AB 2-0 CT1 27 (SUTURE) ×1
SUT VIC AB 2-0 CT1 TAPERPNT 27 (SUTURE) ×1 IMPLANT
SUT VIC AB 2-0 CT2 27 (SUTURE) ×2 IMPLANT
TAPE LABRALWHITE 1.5X36 (TAPE) ×2 IMPLANT
TAPE SUT LABRALTAP WHT/BLK (SUTURE) ×2 IMPLANT
TOWEL OR 17X26 10 PK STRL BLUE (TOWEL DISPOSABLE) ×2 IMPLANT
TOWEL OR NON WOVEN STRL DISP B (DISPOSABLE) ×2 IMPLANT
TOWER SMARTMIX MINI (MISCELLANEOUS) IMPLANT
WATER STERILE IRR 1000ML POUR (IV SOLUTION) ×2 IMPLANT

## 2018-09-19 NOTE — Anesthesia Procedure Notes (Signed)
Procedure Name: Intubation Date/Time: 09/19/2018 11:20 AM Performed by: Pearson Grippe, CRNA Pre-anesthesia Checklist: Patient identified, Emergency Drugs available, Suction available and Patient being monitored Patient Re-evaluated:Patient Re-evaluated prior to induction Oxygen Delivery Method: Circle system utilized Preoxygenation: Pre-oxygenation with 100% oxygen Induction Type: IV induction Ventilation: Mask ventilation without difficulty Laryngoscope Size: Miller and 2 Grade View: Grade I Tube type: Oral Number of attempts: 1 Airway Equipment and Method: Stylet and Oral airway Placement Confirmation: ETT inserted through vocal cords under direct vision,  positive ETCO2 and breath sounds checked- equal and bilateral Secured at: 21 cm Tube secured with: Tape Dental Injury: Teeth and Oropharynx as per pre-operative assessment  Comments: ATOI

## 2018-09-19 NOTE — Anesthesia Postprocedure Evaluation (Signed)
Anesthesia Post Note  Patient: Erica Fisher  Procedure(s) Performed: TOTAL SHOULDER ARTHROPLASTY (Right Shoulder) MINOR HARDWARE REMOVAL (Right Shoulder)     Patient location during evaluation: PACU Anesthesia Type: Regional and General Level of consciousness: awake and alert Pain management: pain level controlled Vital Signs Assessment: post-procedure vital signs reviewed and stable Respiratory status: spontaneous breathing, nonlabored ventilation, respiratory function stable and patient connected to nasal cannula oxygen Cardiovascular status: blood pressure returned to baseline and stable Postop Assessment: no apparent nausea or vomiting Anesthetic complications: no    Last Vitals:  Vitals:   09/19/18 1500 09/19/18 1526  BP: 128/78 127/80  Pulse: (!) 101 94  Resp: 12 14  Temp: 36.7 C 36.8 C  SpO2: 96% 99%    Last Pain:  Vitals:   09/19/18 1526  TempSrc: Oral  PainSc: 0-No pain                 Ryan P Ellender

## 2018-09-19 NOTE — Transfer of Care (Signed)
Immediate Anesthesia Transfer of Care Note  Patient: Erica Fisher  Procedure(s) Performed: TOTAL SHOULDER ARTHROPLASTY (Right Shoulder) MINOR HARDWARE REMOVAL (Right Shoulder)  Patient Location: PACU  Anesthesia Type:GA combined with regional for post-op pain  Level of Consciousness: drowsy and patient cooperative  Airway & Oxygen Therapy: Patient Spontanous Breathing and Patient connected to face mask oxygen  Post-op Assessment: Report given to RN and Post -op Vital signs reviewed and stable  Post vital signs: Reviewed and stable  Last Vitals:  Vitals Value Taken Time  BP 144/80   Temp    Pulse 103 09/19/2018  1:54 PM  Resp 20 09/19/2018  1:54 PM  SpO2 100 % 09/19/2018  1:54 PM  Vitals shown include unvalidated device data.  Last Pain:  Vitals:   09/19/18 0755  TempSrc: Oral         Complications: No apparent anesthesia complications

## 2018-09-19 NOTE — Plan of Care (Signed)
Plan of care for post op day 0 discussed with patient.  

## 2018-09-19 NOTE — Progress Notes (Signed)
Assisted Dr. Ellender with right, ultrasound guided, supraclavicular block. Side rails up, monitors on throughout procedure. See vital signs in flow sheet. Tolerated Procedure well. 

## 2018-09-19 NOTE — Discharge Instructions (Signed)
Discharge Instructions after Total Shoulder Arthroplasty   A sling has been provided for you. Remove the sling 5 times each day to perform motion exercises. After the first 48 to 72 hours, discontinue using the sling. You should use the sling as a protective device, if you are in a crowd.  Use ice on the shoulder intermittently over the first 48 hours after surgery.  Pain medication has been prescribed for you.  Use your medication liberally over the first 48 hours, and then begin to taper your use. You may take Extra Strength Tylenol or Tylenol only in place of the pain pills. DO NOT take ANY nonsteroidal anti-inflammatory pain medications: Advil, Motrin, Ibuprofen, Aleve, Naproxen, or Naprosyn. Take one aspirin a day for 2 weeks after surgery, unless you have an aspirin sensitivity/allergy or asthma. Leave your dressing on until your first follow up visit.  You may shower with the dressing.  Hold your arm as if you still have your sling on while you shower. Active reaching and lifting are not permitted. You may use the operative arm for activities of daily living that do not require the operative arm to leave the side of the body, such as eating, drinking, bathing, etc.  Three to 5 times each day you should perform assisted overhead reaching and external rotation (outward turning) exercises with the operative arm. You were taught these exercises prior to discharge. Both exercises should be done with the non-operative arm used as the "therapist arm" while the operative arm remains relaxed. Ten of each exercise should be done three to five times each day.   Overhead reach is helping to lift your stiff arm up as high as it will go. To stretch your overhead reach, lie flat on your back, relax, and grasp the wrist of the tight shoulder with your opposite hand. Using the power in your opposite arm, bring the stiff arm up as far as it is comfortable. Start holding it for ten seconds and then work up to where  you can hold it for a count of 30. Breathe slowly and deeply while the arm is moved. Repeat this stretch ten times, trying to help the ar up a little higher each time.     External rotation is turning the arm out to the side while your elbow stays close to your body. External rotation is best stretched while you are lying on your back. Hold a cane, yardstick, broom handle, or dowel in both hands. Bend both elbows to a right angle. Use steady, gentle force from your normal arm to rotate the hand of the stiff shoulder out away from your body. Continue the rotation until it is straight in front of you holding it there for a count of 10. Do not go beyond this level of rotation until seen back by Dr. Jairus Tonne. Repeat this exercise ten times slowly.      Please call [phone] during normal business hours or [phone] after hours for any problems. Including the following:  - excessive redness of the incisions - drainage for more than 4 days - fever of more than 101.5 F  *Please note that pain medications will not be refilled after hours or on weekends.    

## 2018-09-19 NOTE — H&P (Signed)
Erica Fisher is an 63 y.o. female.   Chief Complaint: R shoulder pain and dysfunction HPI: 63 year old female status post ORIF right proximal humerus fracture 3 years ago with AVN.  Pain and dysfunction has worsened over the last year.  Her quality of life is significantly limited at this point and she wishes to proceed with arthroplasty to relieve pain.  Past Medical History:  Diagnosis Date  . GERD (gastroesophageal reflux disease)   . Hyperlipemia   . MVP (mitral valve prolapse)   . PONV (postoperative nausea and vomiting)     Past Surgical History:  Procedure Laterality Date  . ABDOMINAL HYSTERECTOMY    . CHOLECYSTECTOMY    . FOOT NEUROMA SURGERY    . hand surg Left    several reconstructive surgeries following accident  . ORIF HUMERUS FRACTURE Right 04/28/2015   Procedure: OPEN TREATMENT OF RIGHT PROXIMAL HUMERUS FRACTURE;  Surgeon: Mack Hook, MD;  Location:  [facility];  Service: Orthopedics;  Laterality: Right;    History reviewed. No pertinent family history. Social History:  reports that she has never smoked. She has never used smokeless tobacco. She reports that she does not drink alcohol or use drugs.  Allergies:  Allergies  Allergen Reactions  . Amoxicillin Rash    Hives   . Dilaudid [Hydromorphone Hcl] Palpitations    Restlessness  . Penicillins Rash    Did it involve swelling of the face/tongue/throat, SOB, or low BP? No Did it involve sudden or severe rash/hives, skin peeling, or any reaction on the inside of your mouth or nose? Yes Did you need to seek medical attention at a hospital or doctor's office? Yes When did it last happen?63 years old If all above answers are "NO", may proceed with cephalosporin use.   . Sulfa Antibiotics Rash    Medications Prior to Admission  Medication Sig Dispense Refill  . Biotin 1000 MCG tablet Take 1,000 mcg by mouth daily.    . Cholecalciferol (VITAMIN D3) 125 MCG (5000 UT) CAPS Take 5,000  Units by mouth daily.    . Multiple Vitamins-Minerals (MULTIVITAMIN WITH MINERALS) tablet Take 1 tablet by mouth daily.    . Omega-3 Fatty Acids (FISH OIL) 1000 MG CAPS Take 1,000 mg by mouth daily.    . Turmeric 500 MG CAPS Take 500 mg by mouth daily.       No results found for this or any previous visit (from the past 48 hour(s)). No results found.  Review of Systems  All other systems reviewed and are negative.   Blood pressure 134/69, pulse 76, temperature 98.2 F (36.8 C), temperature source Oral, resp. rate 13, SpO2 100 %. Physical Exam  Constitutional: She is oriented to person, place, and time. She appears well-developed and well-nourished.  HENT:  Head: Atraumatic.  Eyes: EOM are normal.  Cardiovascular: Intact distal pulses.  Respiratory: Effort normal.  Musculoskeletal:     Comments: R shoulder with healed previous scar, pain with limited ROM. NVID.  Neurological: She is alert and oriented to person, place, and time.  Skin: Skin is warm and dry.  Psychiatric: She has a normal mood and affect.     Assessment/Plan Painful right shoulder avascular necrosis status post ORIF proximal humerus fracture with retained hardware Plan removal of deep hardware with revision to total shoulder arthroplasty. Risks / benefits of surgery discussed Consent on chart  NPO for OR Preop antibiotics   Berline Lopes, MD 09/19/2018, 10:45 AM

## 2018-09-19 NOTE — Op Note (Signed)
Procedure(s): TOTAL SHOULDER ARTHROPLASTY MINOR HARDWARE REMOVAL Procedure Note  Erica Fisher female 63 y.o. 09/19/2018  Preoperative diagnosis: #1 retained hardware right proximal humerus #2 right shoulder avascular necrosis  Postoperative diagnosis: Same  Procedure(s) and Anesthesia Type:    *Right TOTAL SHOULDER ARTHROPLASTY - General    *Deep HARDWARE REMOVAL right shoulder- General  Surgeon(s) and Role:    Jones Broom, MD - Primary   Indications:  63 y.o. female  With painful right shoulder avascular necrosis status post ORIF right proximal humerus fracture 3 years ago with a locking plate. Pain and dysfunction interfered with quality of life and nonoperative treatment with activity modification, NSAIDS and injections failed.     Surgeon: Berline Lopes   Assistants: Lela RNFA  Anesthesia: General endotracheal anesthesia with preoperative interscalene block given by the attending anesthesiologist    Procedure Detail  TOTAL SHOULDER ARTHROPLASTY, MINOR HARDWARE REMOVAL  Findings: The hardware including all pegs screws and plate were removed without difficulty.  Tornier flex anatomic press-fit size 1 stem with a 43 head, cemented size small 40 Cortiloc glenoid.     Estimated Blood Loss:  200 mL         Drains: None   Blood Given: none          Specimens: none        Complications:  * No complications entered in OR log *         Disposition: PACU - hemodynamically stable.         Condition: stable    Procedure:   The patient was identified in the preoperative holding area where I personally marked the operative extremity after verifying with the patient and consent. She  was taken to the operating room where She was transferred to the   operative table.  The patient received an interscalene block in   the holding area by the attending anesthesiologist.  General anesthesia was induced   in the operating room without complication.  The patient  did receive IV  Ancef prior to the commencement of the procedure.  The patient was   placed in the beach-chair position with the back raised about 30   degrees.  The nonoperative extremity and head and neck were carefully   positioned and padded protecting against neurovascular compromise.  The   left upper extremity was then prepped and draped in the standard sterile   fashion.    The appropriate operative time-out was performed with   Anesthesia, the perioperative staff, as well as myself and we all agreed   that the right side was the correct operative site.  An approximately   10 cm incision was made from the tip of the coracoid to the center point of the   humerus at the level of the axilla.  Dissection was carried down sharply   through subcutaneous tissues and she was noted to have a fair amount of scar tissue in the deltopectoral interval.  She did not have an identifiable cephalic vein.  The deltoid was taken laterally and the pectoralis major was taken medially.  The   upper 1 cm of the pectoralis major was released from its attachment on   the humerus.  The conjoined tendon was identified and the lateral edge was exposed.  The undersurface of the conjoined tendon was carefully dissected from the subscapularis.  The axillary nerve was palpated and protected throughout the procedure.    Musculocutaneous nerve was not palpated in the operative   field.  Conjoined tendon was then retracted gently medially and the   deltoid laterally.  The deltoid was carefully elevated off of the plate and lateral humerus.  It was also separated from the rotator cuff proximally.  The interval between the rotator cuff and the acromion was developed.  The plate was identified and all screws were removed without difficulty.  The plate was then removed.  Several sutures were also removed.  At this point the joint was externally rotated and the lesser tuberosity was identified by palpation.  The subscapularis was  tagged and then taken down as a peel with the underlying capsule.  The biceps long head was absent.   The capsule was then   released all the way down to the 6 o'clock position of the humeral head.   The humeral head was then delivered with simultaneous adduction,   extension and external rotation.  The head was noted to be without any significant superior angulation and excess posterior retroversion.  The central cartilage was soft and separating consistent with avascular necrosis.  The head was then marked and cut at the best estimation of the anatomic neck.    At that point, the humeral head was retracted posteriorly with   a Fukuda retractor.   Remaining portion of the capsule was released at the base of the   coracoid.  The remaining biceps anchor and the entire anterior-inferior   labrum was excised.  The posterior labrum was also excised but the   posterior capsule was not released.  The guidepin was placed bicortically with non elevated guide.  The reamer was used to ream to concentric bone with punctate bleeding.  This gave an excellent concentric surface.  The center hole was then drilled for an anchor peg glenoid followed by the three peripheral holes and none of the holes   exited the glenoid wall.  I then pulse irrigated these holes and dried   them with Surgicel.  The three peripheral holes were then   pressurized cemented and the anchor peg glenoid was placed and impacted   with an excellent fit.  The glenoid was a 40 small component.  The proximal humerus was then again exposed taking care not to displace the glenoid.    The entry awl was used to enter the canal.  This had to be placed very anterior just up against the anterior cortex to be able to enter the canal given the posteriorly united position of the humeral head.  I was able to reestablish the canal.  Size 1 broach was then used and with the small short implant I was able to place it without any apparent cortical disruption.   I then placed a trial head with a 43.  With the trial implantation of the component, there was appropriate soft tissue tension and no instability. The trial was removed and the final implant was prepared on a back table.  The trial was removed and the final implant was prepared on a back table.   3 small holes were drilled on the medial side of the lesser tuberosity osteotomy, through which 2 labral tapes were passed. The implant was then placed through the loop of the 2 labral tapes and impacted with an excellent press-fit. This achieved excellent anatomic reconstruction of the proximal humerus.  The joint was then copiously irrigated with pulse lavage.  The subscapularis was then repaired using the 2 labral tapes previously passed in a horizontal mattress fashion.  one #1 Ethibond was placed at  the rotator interval just above   the lesser tuberosity. Copious irrigation was used. Skin was closed with 2-0 Vicryl sutures in the deep dermal layer and 4-0 Monocryl in a subcuticular  running fashion.  Sterile dressings were then applied including Aquacel.  The patient was placed in a sling and allowed to awaken from general anesthesia and taken to the recovery room in stable condition.      POSTOPERATIVE PLAN:  Early passive range of motion will be allowed with the goal of 0 degrees external rotation and 90 degrees forward elevation.  No internal rotation at this time.  No active motion of the arm until the subscapularis heals.  The patient will likely be kept in the hospital for 1-2 days and then discharged home.

## 2018-09-19 NOTE — Anesthesia Procedure Notes (Signed)
Anesthesia Regional Block: Interscalene brachial plexus block   Pre-Anesthetic Checklist: ,, timeout performed, Correct Patient, Correct Site, Correct Laterality, Correct Procedure, Correct Position, site marked, Risks and benefits discussed,  Surgical consent,  Pre-op evaluation,  At surgeon's request and post-op pain management  Laterality: Right  Prep: chloraprep       Needles:  Injection technique: Single-shot  Needle Type: Echogenic Stimulator Needle     Needle Length: 9cm  Needle Gauge: 21     Additional Needles:   Procedures:,,,, ultrasound used (permanent image in chart),,,,  Narrative:  Start time: 09/19/2018 10:10 AM End time: 09/19/2018 10:20 AM Injection made incrementally with aspirations every 5 mL.  Performed by: Personally  Anesthesiologist: Leonides Grills, MD  Additional Notes: Functioning IV was confirmed and monitors were applied.  A timeout was performed. Sterile prep, hand hygiene and sterile gloves were used. A 32mm 21ga Arrow echogenic stimulator needle was used. Negative aspiration and negative test dose prior to incremental administration of local anesthetic. The patient tolerated the procedure well.  Ultrasound guidance: relevent anatomy identified, needle position confirmed, local anesthetic spread visualized around nerve(s), vascular puncture avoided.  Image printed for medical record.

## 2018-09-20 ENCOUNTER — Encounter (HOSPITAL_COMMUNITY): Payer: Self-pay | Admitting: Orthopedic Surgery

## 2018-09-20 LAB — CBC
HCT: 37.2 % (ref 36.0–46.0)
Hemoglobin: 12 g/dL (ref 12.0–15.0)
MCH: 30.8 pg (ref 26.0–34.0)
MCHC: 32.3 g/dL (ref 30.0–36.0)
MCV: 95.6 fL (ref 80.0–100.0)
Platelets: 258 10*3/uL (ref 150–400)
RBC: 3.89 MIL/uL (ref 3.87–5.11)
RDW: 12.6 % (ref 11.5–15.5)
WBC: 8.8 10*3/uL (ref 4.0–10.5)
nRBC: 0 % (ref 0.0–0.2)

## 2018-09-20 LAB — BASIC METABOLIC PANEL
Anion gap: 6 (ref 5–15)
BUN: 6 mg/dL (ref 6–20)
CO2: 25 mmol/L (ref 22–32)
Calcium: 8.7 mg/dL — ABNORMAL LOW (ref 8.9–10.3)
Chloride: 109 mmol/L (ref 98–111)
Creatinine, Ser: 0.59 mg/dL (ref 0.44–1.00)
GFR calc Af Amer: >60 mL/min (ref >60)
GFR calc non Af Amer: >60 mL/min (ref >60)
Glucose, Bld: 107 mg/dL — ABNORMAL HIGH (ref 70–99)
Potassium: 3.8 mmol/L (ref 3.5–5.1)
Sodium: 140 mmol/L (ref 135–145)

## 2018-09-20 NOTE — Discharge Summary (Signed)
Patient ID: Erica Fisher MRN: [id number] DOB/AGE: 61-Oct-1957 61 y.o.  Admit date: 09/19/2018 Discharge date: 09/20/2018  Admission Diagnoses:  Active Problems:   Status post total shoulder arthroplasty   Discharge Diagnoses:  Same  Past Medical History:  Diagnosis Date  . GERD (gastroesophageal reflux disease)   . Hyperlipemia   . MVP (mitral valve prolapse)   . PONV (postoperative nausea and vomiting)     Surgeries: Procedure(s): TOTAL SHOULDER ARTHROPLASTY MINOR HARDWARE REMOVAL on 09/19/2018   Consultants:   Discharged Condition: Improved  Hospital Course: Erica Fisher is an 63 y.o. 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 Patient has severe unremitting pain that affects sleep, daily activities, and work/hobbies. After pre-op clearance the patient was taken to the operating room on 09/19/2018 and underwent  Procedure(s): TOTAL SHOULDER ARTHROPLASTY MINOR HARDWARE REMOVAL.    Patient was given perioperative antibiotics:  Anti-infectives (From admission, onward)   Start     Dose/Rate Route Frequency Ordered Stop   09/19/18 2200  vancomycin (VANCOCIN) IVPB 1000 mg/200 mL premix     1,000 mg 200 mL/hr over 60 Minutes Intravenous Every 12 hours 09/19/18 1531 09/19/18 2247   09/19/18 0800  vancomycin (VANCOCIN) IVPB 1000 mg/200 mL premix     1,000 mg 200 mL/hr over 60 Minutes Intravenous On call to O.R. 09/19/18 0747 09/19/18 1055       POD 1: The patient is comfortable with minimal discomfort.  She found the ice pack helpful overnight.  PE: Awake and alert Right upper extremity dressing clean dry and intact. Distally right upper extremity is neurovascularly intact grossly.  She's been using her hand without difficulty. We readjusted the sling.  She feels comfortable for discharge home today after occupational therapy.   Patient was given sequential compression devices, early ambulation, and chemoprophylaxis  to prevent DVT.  Patient benefited maximally from hospital stay and there were no complications.    Recent vital signs:  Patient Vitals for the past 24 hrs:  BP Temp Temp src Pulse Resp SpO2 Height Weight  09/20/18 0525 (!) 120/55 98.4 F (36.9 C) Oral 76 16 97 % - -  09/20/18 0148 (!) 108/58 98.3 F (36.8 C) Oral 71 18 98 % - -  09/19/18 2213 123/67 (!) 97.5 F (36.4 C) Oral 74 17 99 % - -  09/19/18 1825 (!) 121/58 98.2 F (36.8 C) Oral 69 16 100 % - -  09/19/18 1723 122/61 (!) 97.5 F (36.4 C) Oral 71 16 95 % - -  09/19/18 1647 135/66 98.4 F (36.9 C) Oral 73 14 100 % - -  09/19/18 1526 127/80 98.3 F (36.8 C) Oral 94 14 99 %  (1.651 m) 81.3 kg  09/19/18 1500 128/78 98.1 F (36.7 C) - (!) 101 12 96 % - -  09/19/18 1445 126/66 - - (!) 110 16 96 % - -  09/19/18 1430 130/69 - - 99 13 95 % - -  09/19/18 1415 130/71 - - (!) 105 15 96 % - -  09/19/18 1400 (!) 142/82 - - (!) 110 18 100 % - -  09/19/18 1354 (!) 144/80 97.8 F (36.6 C) - 93 20 100 % - -  09/19/18 1039 127/66 - - 83 15 99 % - -  09/19/18 1020 - - - 76 13 100 % - -  09/19/18 1019 134/69 - - 80 14 100 % - -  09/19/18 1018 - - - 84 15 98 % - -  09/19/18 1017 - - - 86 14 100 % - -  09/19/18 1016 - - - 82 10 100 % - -  09/19/18 1015 - - - 83 12 100 % - -  09/19/18 1014 (!) 141/61 - - 93 17 100 % - -  09/19/18 1013 - - - 85 16 98 % - -  09/19/18 1012 - - - 92 19 100 % - -  09/19/18 1011 - - - (!) 103 15 100 % - -  09/19/18 1010 - - - 88 16 100 % - -  09/19/18 1009 (!) 144/69 - - 88 14 100 % - -  09/19/18 1008 - - - 97 15 100 % - -  09/19/18 1007 - - - (!) 107 16 100 % - -  09/19/18 1006 - - - 79 18 100 % - -  09/19/18 1005 - - - 84 13 98 % - -  09/19/18 1004 (!) 143/70 - - 78 11 97 % - -  09/19/18 1000 - - - 81 18 (!) 88 % - -  09/19/18 0955 - - - 81 14 100 % - -  09/19/18 0950 - - - 77 14 100 % - -  09/19/18 0945 - - - 87 15 100 % - -  09/19/18 0940 - - - 82 16 100 % - -  09/19/18 0935 - - - 74 14 100 % -  -  09/19/18 0930 - - - 80 15 100 % - -  09/19/18 0925 (!) 142/79 - - 93 20 100 % - -  09/19/18 0920 - - - 91 12 98 % - -     Recent laboratory studies:  Recent Labs    09/20/18 0313  WBC 8.8  HGB 12.0  HCT 37.2  PLT 258  NA 140  K 3.8  CL 109  CO2 25  BUN 6  CREATININE 0.59  GLUCOSE 107*  CALCIUM 8.7*     Discharge Medications:   Allergies as of 09/20/2018      Reactions   Amoxicillin Rash   Hives    Dilaudid [hydromorphone Hcl] Palpitations   Restlessness   Penicillins Rash   Did it involve swelling of the face/tongue/throat, SOB, or low BP? No Did it involve sudden or severe rash/hives, skin peeling, or any reaction on the inside of your mouth or nose? Yes Did you need to seek medical attention at a hospital or doctor's office? Yes When did it last happen?63 years old If all above answers are "NO", may proceed with cephalosporin use.   Sulfa Antibiotics Rash      Medication List    TAKE these medications   Biotin 1000 MCG tablet Take 1,000 mcg by mouth daily.   Fish Oil 1000 MG Caps Take 1,000 mg by mouth daily.   methocarbamol 500 MG tablet Commonly known as:  Robaxin Take 1 tablet (500 mg total) by mouth 3 (three) times daily as needed for muscle spasms.   multivitamin with minerals tablet Take 1 tablet by mouth daily.   oxyCODONE-acetaminophen 5-325 MG tablet Commonly known as:  Percocet Take 1 tablet by mouth every 4 (four) hours as needed for severe pain.   Turmeric 500 MG Caps Take 500 mg by mouth daily.   Vitamin D3 125 MCG (5000 UT) Caps Take 5,000 Units by mouth daily.       Diagnostic Studies: Dg Shoulder Right Port  Result Date: 09/19/2018 CLINICAL DATA:  Status post shoulder arthroplasty EXAM: PORTABLE  RIGHT SHOULDER COMPARISON:  07/23/2018 FINDINGS: Previously seen fixation sideplate and fixation screws have been removed. Proximal humeral placement is noted with adequate articulation with the glenoid. No acute fracture  dislocation is seen. Prior healed fracture of the proximal humerus is noted. IMPRESSION: Status post shoulder arthroplasty. Electronically Signed   By: Alcide CleverMark  Lukens M.D.   On: 09/19/2018 15:31    Disposition: Discharge disposition: 01-Home or Self Care       Discharge Instructions    Call MD / Call 911   Complete by:  As directed    If you experience chest pain or shortness of breath, CALL 911 and be transported to the hospital emergency room.  If you develope a fever above 101 F, pus (white drainage) or increased drainage or redness at the wound, or calf pain, call your surgeon's office.   Constipation Prevention   Complete by:  As directed    Drink plenty of fluids.  Prune juice may be helpful.  You may use a stool softener, such as Colace (over the counter) 100 mg twice a day.  Use MiraLax (over the counter) for constipation as needed.   Diet - low sodium heart healthy   Complete by:  As directed    Driving restrictions   Complete by:  As directed    No driving for 6 weeks   Increase activity slowly as tolerated   Complete by:  As directed       Follow-up Information    Jones Broomhandler, Lallie Strahm, MD. Schedule an appointment as soon as possible for a visit in 2 weeks.   Specialty:  Orthopedic Surgery Contact information: [address] [place] [id number] [phone]            Signed: Berline LopesJustin W Kaiyah Eber 09/20/2018, 9:10 AM

## 2018-09-20 NOTE — Evaluation (Addendum)
Occupational Therapy Evaluation Patient Details Name: Erica Fisher MRN: [id number] DOB: 10/29/1955 Today's Date: 09/20/2018    History of Present Illness pt is a 63 y/o female now s/p R TSA   Clinical Impression   This 63 y/o female presents with the above. PTA pt reports independence with ADL, iADL and functional mobility. Pt presents up in room pleasant and willing to engage in therapy session, though with limitations as session continued due to onset of nausea. Pt currently requires minA for UB and LB ADL given RUE deficits. Reviewed shoulder precautions, sling management, HEP, safety and compensatory strategies for performing ADL and functional transfers while maintaining precautions with pt verbalizing and return demonstrating good understanding throughout, min cues required for return demo of HEP. Pt reports spouse will be home to assist with ADL/iADL PRN after discharge. Education provided and questions answered throughout. Pt anticipating d/c home later today.     Follow Up Recommendations  Follow surgeon's recommendation for DC plan and follow-up therapies;Supervision/Assistance - 24 hour    Equipment Recommendations  None recommended by OT           Precautions / Restrictions Precautions Precautions: Shoulder Type of Shoulder Precautions: NWB UE, okay for AROM e/w/h, no AROM to R shoulder, okay for PROM within following limits: FF 0-90, ER to neutral, okay for pendulums Shoulder Interventions: Shoulder sling/immobilizer;Off for dressing/bathing/exercises;At all times Precaution Booklet Issued: Yes (comment) Precaution Comments: issued and reviewed with pt Required Braces or Orthoses: Sling Restrictions Weight Bearing Restrictions: Yes RUE Weight Bearing: Non weight bearing      Mobility Bed Mobility               General bed mobility comments: pt OOB upon arrival  Transfers Overall transfer level: Modified independent Equipment used: None                   Balance Overall balance assessment: No apparent balance deficits (not formally assessed)                                         ADL either performed or assessed with clinical judgement   ADL Overall ADL's : Needs assistance/impaired Eating/Feeding: Sitting;Set up Eating/Feeding Details (indicate cue type and reason): pt able to use LUE to support R hand with bringing cup to mouth (unable to fully perform task with only L hand due to baseline injury) Grooming: Set up;Sitting   Upper Body Bathing: Minimal assistance;Sitting   Lower Body Bathing: Minimal assistance;Sit to/from stand   Upper Body Dressing : Minimal assistance;Sitting;Standing Upper Body Dressing Details (indicate cue type and reason): assist to doff/don sling today, verbally reviewed technique for sling management and donning UB clothing with pt verbalizing understanding, pt dressed upon arrival Lower Body Dressing: Minimal assistance;Sit to/from stand   Toilet Transfer: Supervision/safety;Modified Independent;Ambulation Toilet Transfer Details (indicate cue type and reason): simulated via transfer to recliner/EOB Toileting- Clothing Manipulation and Hygiene: Supervision/safety;Sit to/from Nurse, children's Details (indicate cue type and reason): educated to use shower seat for increased safety during task Functional mobility during ADLs: Supervision/safety General ADL Comments: educated pt in shoulder precautions, safety and compensatory strategies for performing ADL while maintaining precautions, pt verbalizing understanding; with increased nausea during session      Vision         Perception     Praxis      Pertinent Vitals/Pain Pain  Assessment: 0-10 Pain Score: 7  Pain Descriptors / Indicators: Discomfort;Guarding;Sore Pain Intervention(s): Monitored during session;Repositioned;Premedicated before session     Hand Dominance Right   Extremity/Trunk Assessment Upper  Extremity Assessment Upper Extremity Assessment: RUE deficits/detail;LUE deficits/detail RUE Deficits / Details: s/p R TSA RUE: Unable to fully assess due to immobilization RUE Sensation: (reports some tingling, but reports nerve block has worn off) LUE Deficits / Details: pt with missing digits and portion of hand from injury as a child LUE Coordination: decreased fine motor   Lower Extremity Assessment Lower Extremity Assessment: Overall WFL for tasks assessed       Communication Communication Communication: No difficulties   Cognition Arousal/Alertness: Awake/alert Behavior During Therapy: WFL for tasks assessed/performed Overall Cognitive Status: Within Functional Limits for tasks assessed                                     General Comments       Exercises Exercises: General Upper Extremity;Hand exercises;Shoulder General Exercises - Upper Extremity Shoulder Flexion: (verbally reviewed PROM to 90*) Elbow Flexion: AROM;Right;10 reps Elbow Extension: AROM;Right;10 reps Wrist Flexion: AROM;Right;10 reps Wrist Extension: AROM;Right;10 reps Digit Composite Flexion: AROM;Right;10 reps Composite Extension: AROM;Right;10 reps Shoulder Exercises Pendulum Exercise: PROM;5 reps;Right(verbally reviewed and demo'd as well, min cues for technique) Shoulder External Rotation: PROM(verbally reviewed 0-neutral) Neck Flexion: AROM;Seated Neck Extension: AROM;Seated Neck Lateral Flexion - Right: AROM;Seated Neck Lateral Flexion - Left: AROM;Seated Hand Exercises Forearm Supination: AROM;Right;10 reps Forearm Pronation: AROM;Right;10 reps   Shoulder Instructions Shoulder Instructions Donning/doffing shirt without moving shoulder: Minimal assistance;Patient able to independently direct caregiver Method for sponge bathing under operated UE: Min-guard;Patient able to independently direct caregiver Donning/doffing sling/immobilizer: Minimal assistance;Patient able to  independently direct caregiver Correct positioning of sling/immobilizer: Min-guard;Patient able to independently direct caregiver Pendulum exercises (written home exercise program): Min-guard ROM for elbow, wrist and digits of operated UE: Min-guard Sling wearing schedule (on at all times/off for ADL's): Independent Proper positioning of operated UE when showering: Min-guard Positioning of UE while sleeping: Minimal assistance;Patient able to independently direct caregiver    Home Living Family/patient expects to be discharged to:: Private residence Living Arrangements: Spouse/significant other Available Help at Discharge: Family                         Home Equipment: Shower seat          Prior Functioning/Environment Level of Independence: Independent        Comments: wokring         OT Problem List: Decreased range of motion;Decreased strength;Decreased coordination;Decreased knowledge of precautions;Pain;Impaired UE functional use      OT Treatment/Interventions:      OT Goals(Current goals can be found in the care plan section) Acute Rehab OT Goals Patient Stated Goal: home today OT Goal Formulation: All assessment and education complete, DC therapy  OT Frequency:     Barriers to D/C:            Co-evaluation              AM-PAC OT "6 Clicks" Daily Activity     Outcome Measure Help from another person eating meals?: A Little Help from another person taking care of personal grooming?: A Little Help from another person toileting, which includes using toliet, bedpan, or urinal?: A Little Help from another person bathing (including washing, rinsing, drying)?: A Little Help from another  person to put on and taking off regular upper body clothing?: A Little Help from another person to put on and taking off regular lower body clothing?: A Little 6 Click Score: 18   End of Session Equipment Utilized During Treatment: (sling) Nurse Communication:  Mobility status  Activity Tolerance: Patient tolerated treatment well;Other (comment)(limited due to nausea) Patient left: in chair;with call bell/phone within reach  OT Visit Diagnosis: Pain;Muscle weakness (generalized) (M62.81) Pain - Right/Left: Right Pain - part of body: Shoulder                Time: [id number] OT Time Calculation (min): 28 min Charges:  OT General Charges $OT Visit: 1 Visit OT Evaluation $OT Eval Low Complexity: 1 Low OT Treatments $Self Care/Home Management : 8-22 mins  Marcy SirenBreanna Mayzie Caughlin, OT [facility] Pager [phone] Office [phone]   Orlando PennerBreanna L Clayden Withem 09/20/2018, 10:52 AM

## 2020-07-03 IMAGING — DX CHEST - 2 VIEW
2 series · 2 of 2 positions shown · non-contrast
Comparison: No recent prior.

CLINICAL DATA: Shoulder replacement.

EXAM:
CHEST - 2 VIEW

[chest pa]
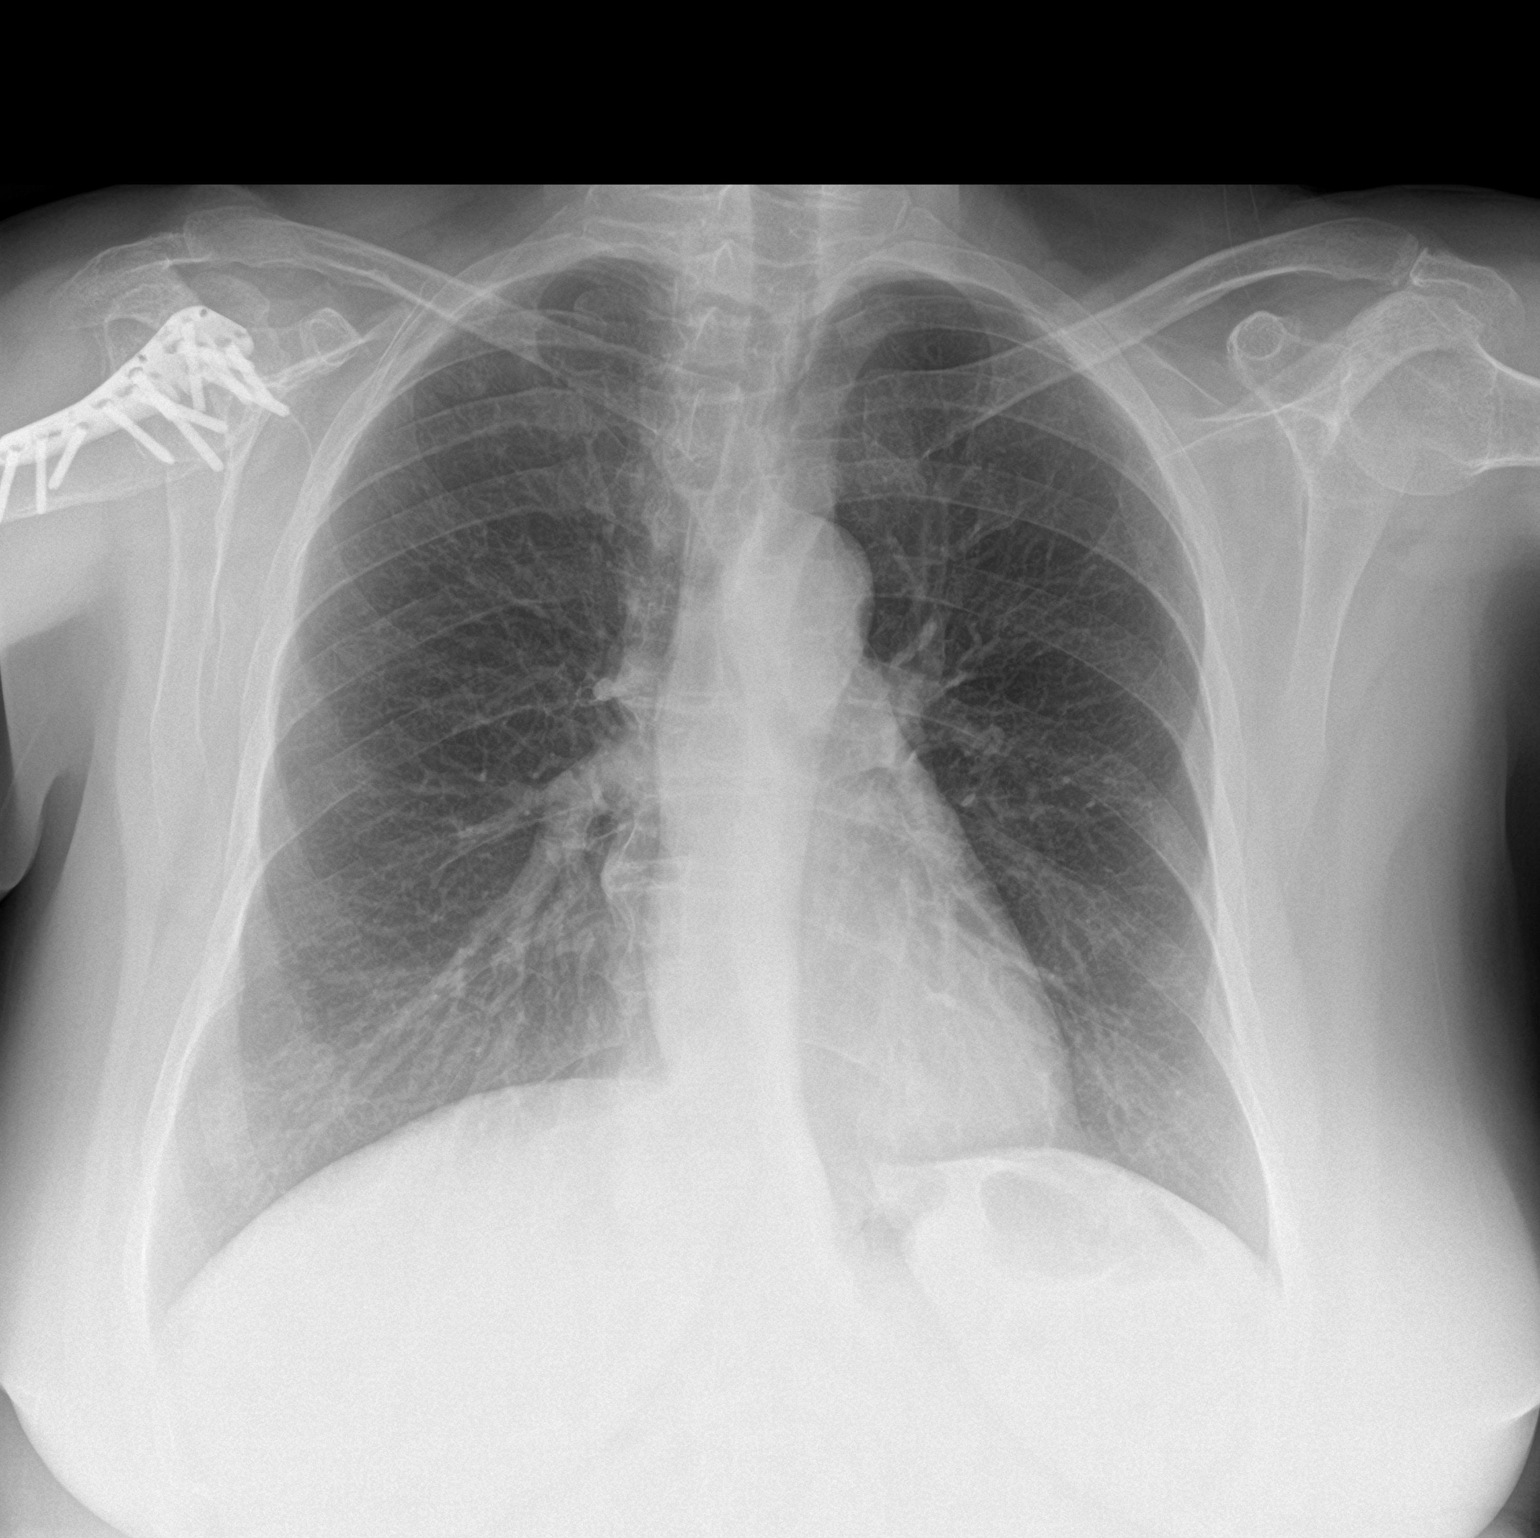

[chest lat]
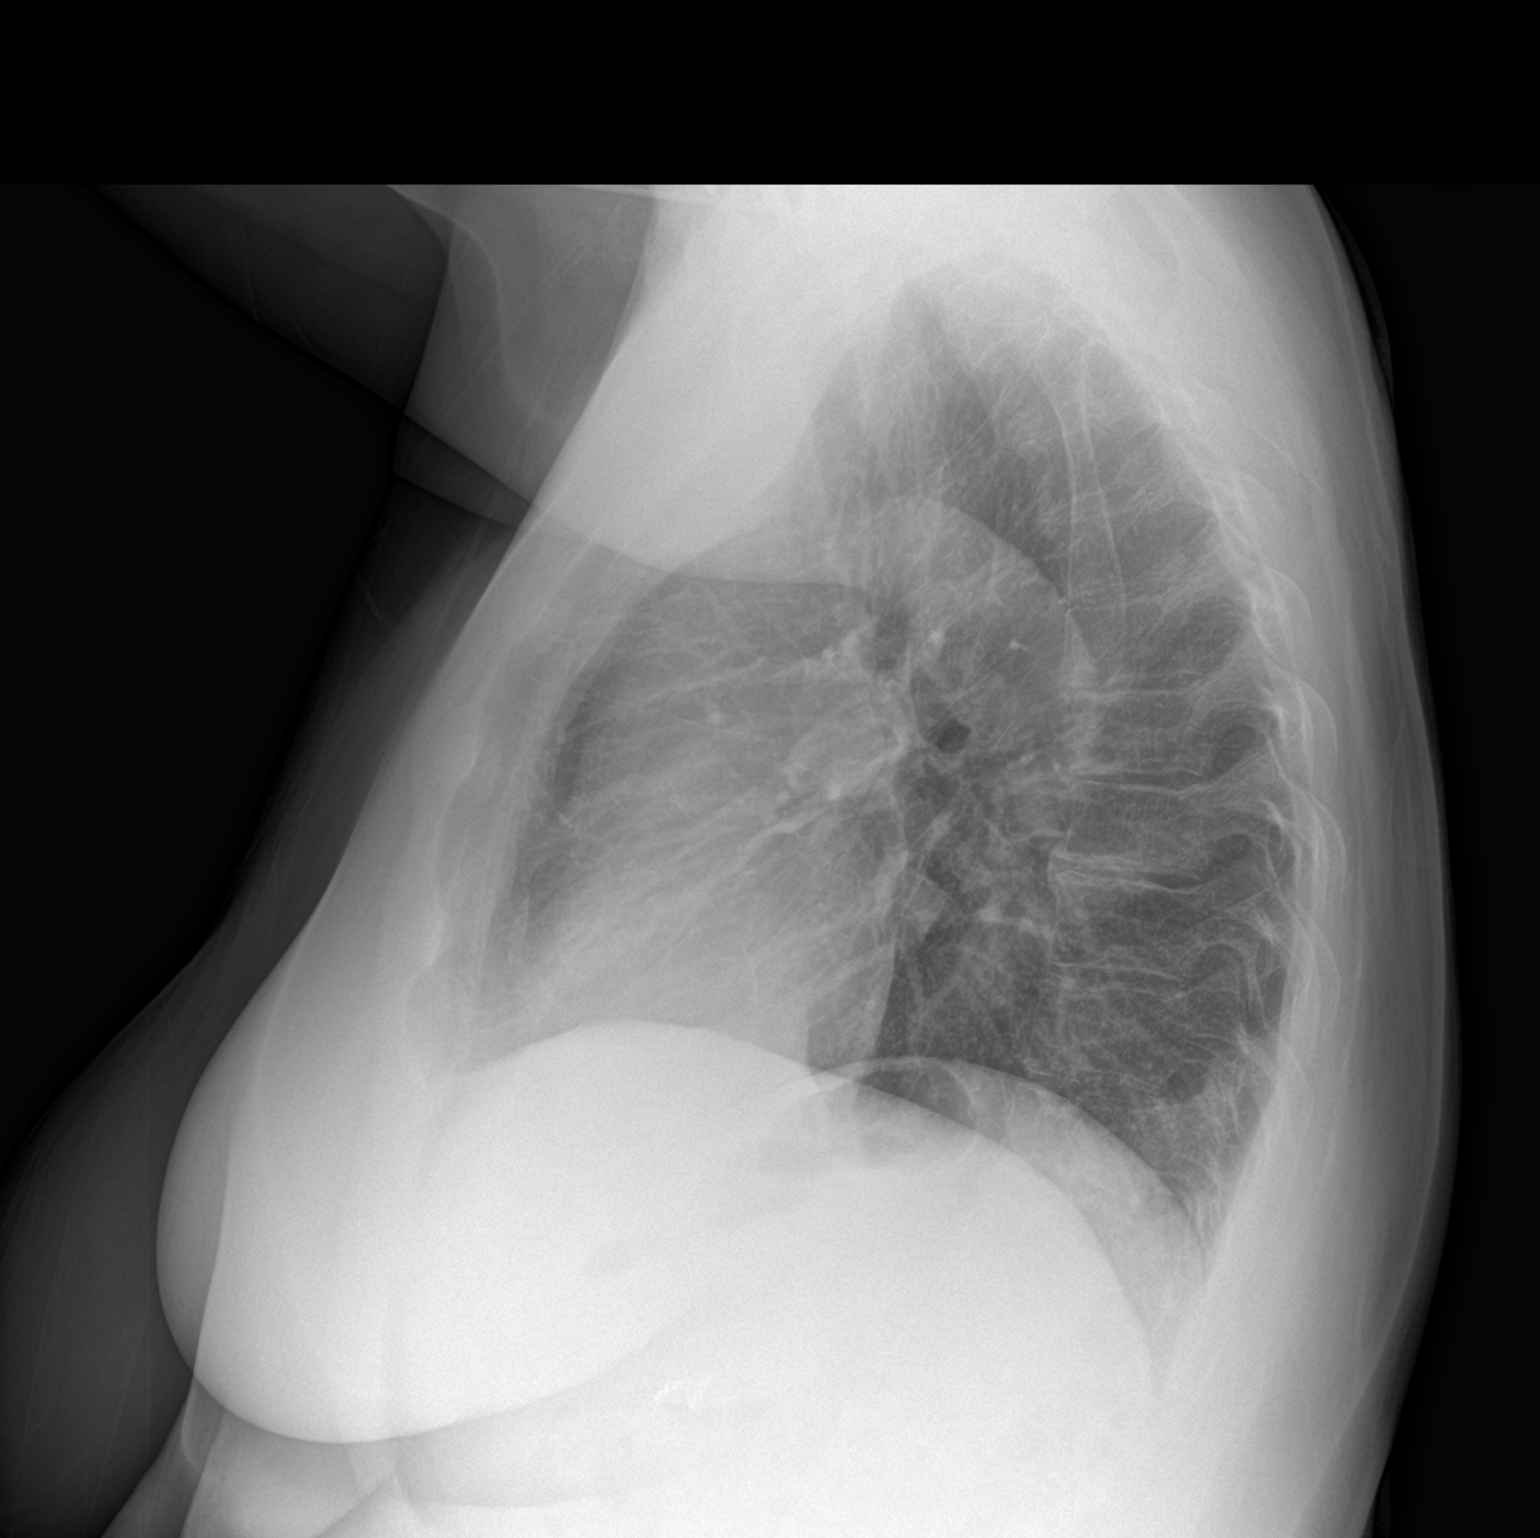

[2 of 2 positions shown; findings below may reference images not displayed]

FINDINGS: Mediastinum and hilar structures normal. Lungs are clear. No pleural
effusion or pneumothorax. Heart size normal. Postsurgical changes
right shoulder. Thoracic spine scoliosis. Degenerative change
thoracic spine.
IMPRESSION: No acute cardiopulmonary disease. Postsurgical changes right
shoulder.

## 2020-08-30 IMAGING — DX PORTABLE RIGHT SHOULDER
3 series · 3 of 3 positions shown · non-contrast
Comparison: 07/23/2018

CLINICAL DATA: Status post shoulder arthroplasty

EXAM:
PORTABLE RIGHT SHOULDER

[shoulder ap]
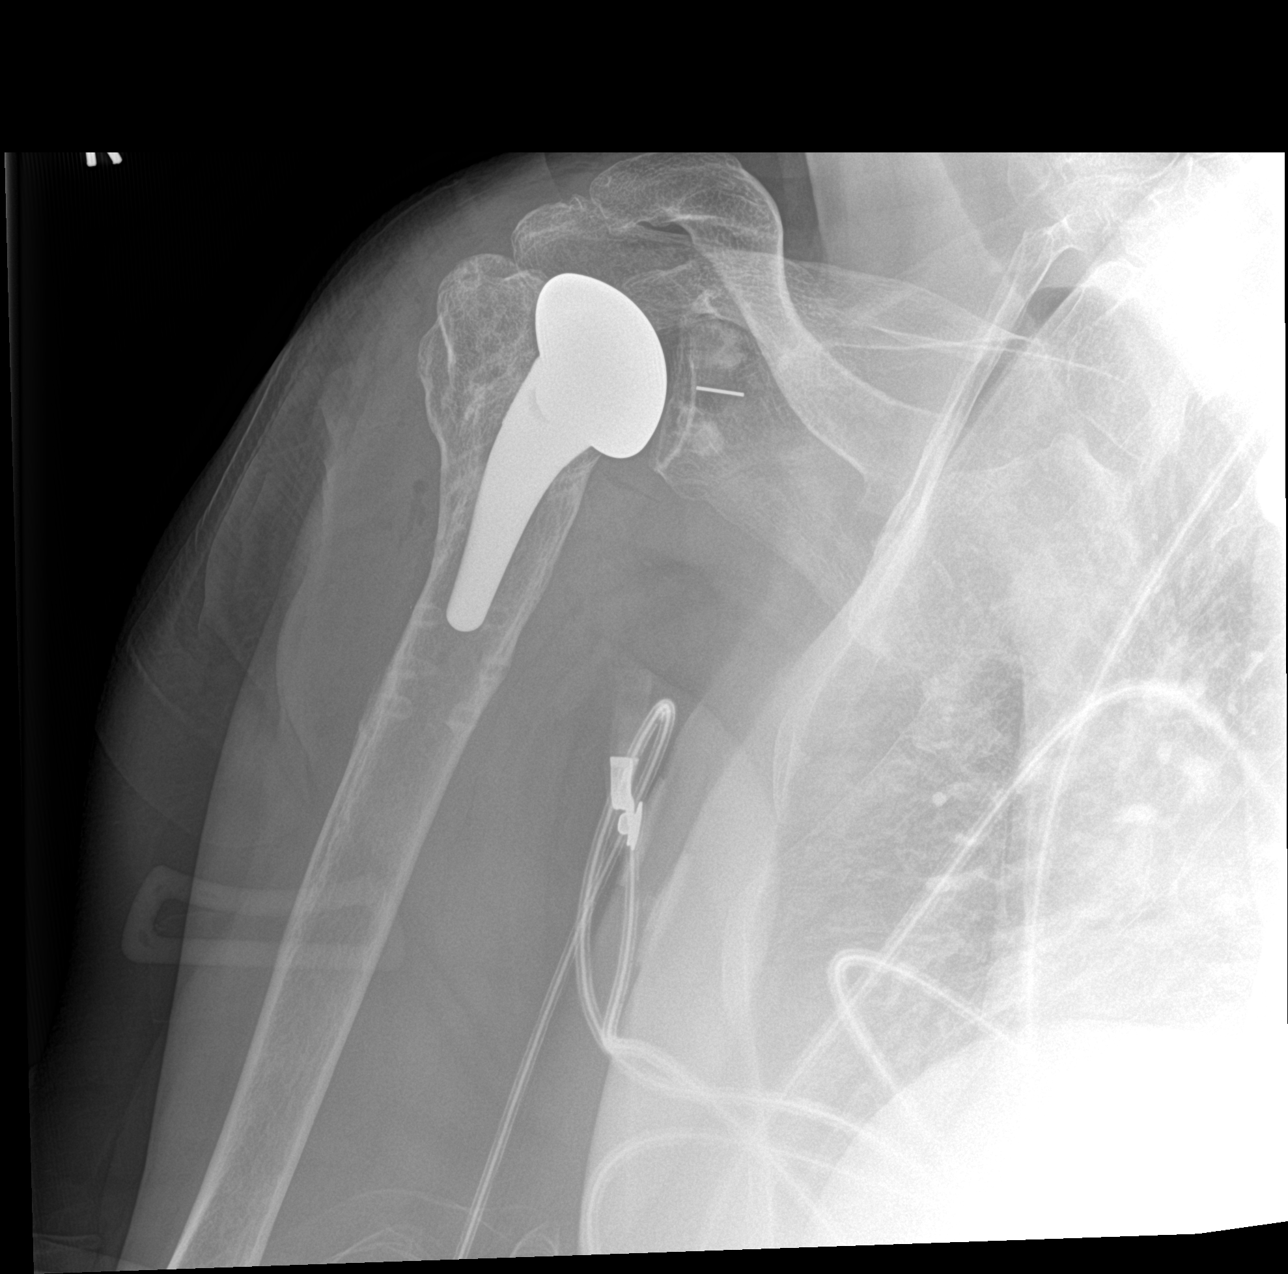

[shoulder obl (1 of 2)]
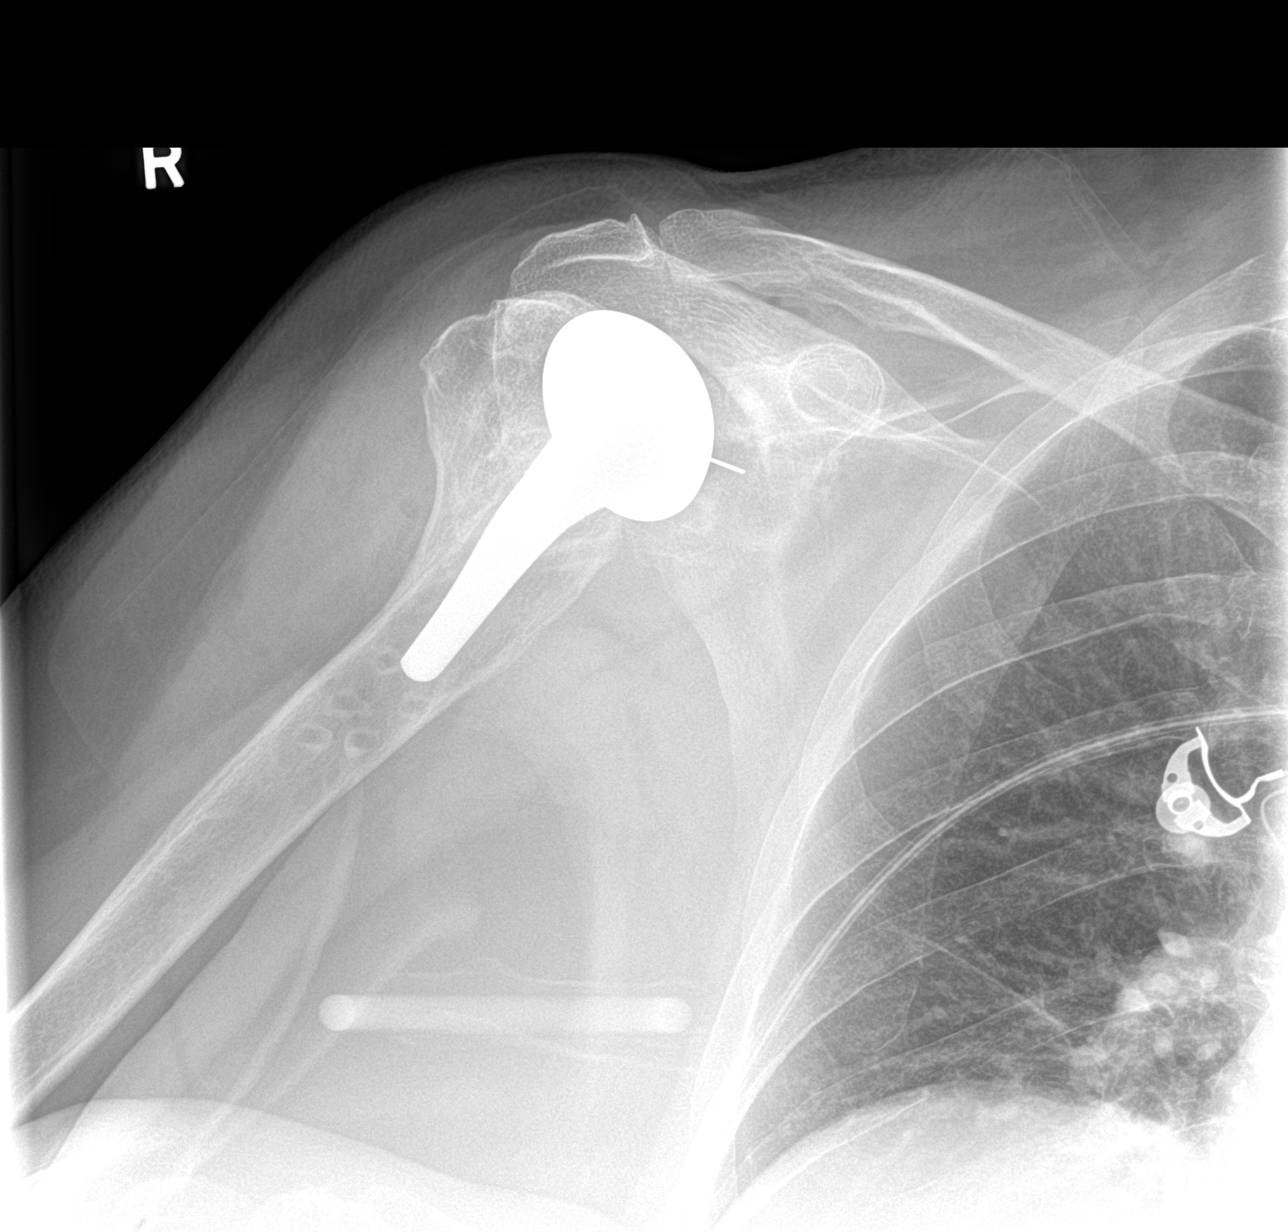

[shoulder obl (2 of 2)]
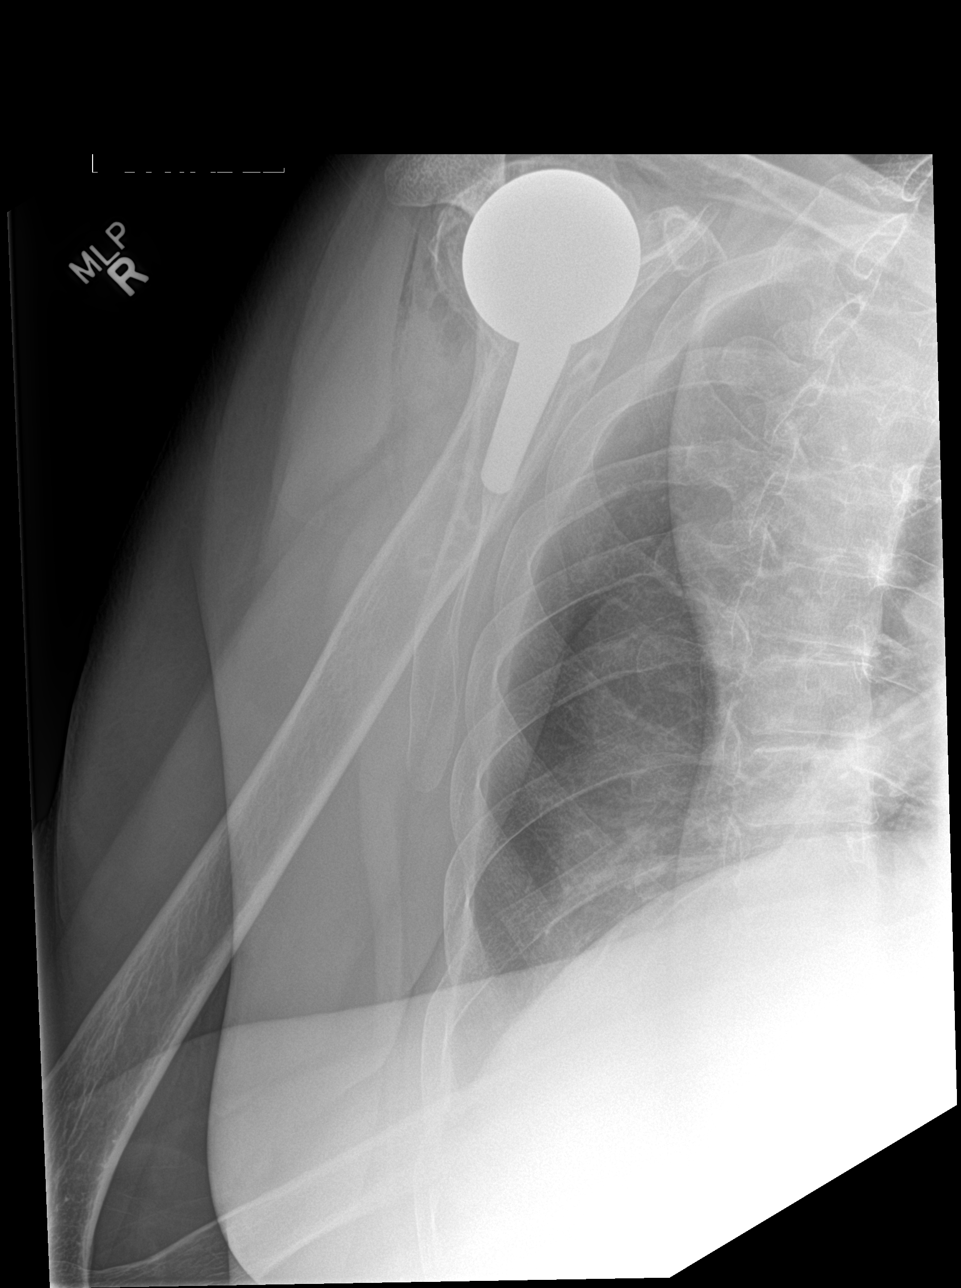

[3 of 3 positions shown; findings below may reference images not displayed]

FINDINGS: Previously seen fixation sideplate and fixation screws have been
removed. Proximal humeral placement is noted with adequate
articulation with the glenoid. No acute fracture dislocation is
seen. Prior healed fracture of the proximal humerus is noted.
IMPRESSION: Status post shoulder arthroplasty.
